# Patient Record
Sex: Male | Born: 1945 | Race: Black or African American | Hispanic: No | Smoking: Former smoker
Health system: Southern US, Community
[De-identification: ages and names within clinical notes are randomized; demographics above are authoritative.]

## PROBLEM LIST (undated history)

## (undated) DIAGNOSIS — G4730 Sleep apnea, unspecified: Secondary | ICD-10-CM

## (undated) DIAGNOSIS — I10 Essential (primary) hypertension: Secondary | ICD-10-CM

## (undated) DIAGNOSIS — N400 Enlarged prostate without lower urinary tract symptoms: Secondary | ICD-10-CM

## (undated) DIAGNOSIS — I2510 Atherosclerotic heart disease of native coronary artery without angina pectoris: Secondary | ICD-10-CM

## (undated) DIAGNOSIS — R7303 Prediabetes: Secondary | ICD-10-CM

## (undated) DIAGNOSIS — G4733 Obstructive sleep apnea (adult) (pediatric): Secondary | ICD-10-CM

## (undated) DIAGNOSIS — F32A Depression, unspecified: Secondary | ICD-10-CM

## (undated) DIAGNOSIS — R600 Localized edema: Secondary | ICD-10-CM

## (undated) DIAGNOSIS — G709 Myoneural disorder, unspecified: Secondary | ICD-10-CM

## (undated) DIAGNOSIS — E119 Type 2 diabetes mellitus without complications: Secondary | ICD-10-CM

## (undated) DIAGNOSIS — R972 Elevated prostate specific antigen [PSA]: Secondary | ICD-10-CM

## (undated) DIAGNOSIS — R0609 Other forms of dyspnea: Secondary | ICD-10-CM

## (undated) DIAGNOSIS — K219 Gastro-esophageal reflux disease without esophagitis: Secondary | ICD-10-CM

## (undated) DIAGNOSIS — R0600 Dyspnea, unspecified: Secondary | ICD-10-CM

## (undated) HISTORY — DX: Elevated prostate specific antigen (PSA): R97.20

## (undated) HISTORY — PX: COLON SURGERY: SHX602

---

## 2004-07-27 ENCOUNTER — Ambulatory Visit: Payer: Self-pay | Admitting: Gastroenterology

## 2004-08-22 ENCOUNTER — Inpatient Hospital Stay: Payer: Self-pay | Admitting: General Surgery

## 2004-09-03 ENCOUNTER — Emergency Department: Payer: Self-pay | Admitting: General Surgery

## 2004-09-06 ENCOUNTER — Inpatient Hospital Stay: Payer: Self-pay | Admitting: General Surgery

## 2005-01-21 HISTORY — PX: COLON RESECTION: SHX5231

## 2005-06-28 ENCOUNTER — Inpatient Hospital Stay: Payer: Self-pay | Admitting: Internal Medicine

## 2005-09-12 ENCOUNTER — Ambulatory Visit: Payer: Self-pay | Admitting: Gastroenterology

## 2009-10-05 ENCOUNTER — Ambulatory Visit: Payer: Self-pay | Admitting: Gastroenterology

## 2012-09-03 ENCOUNTER — Ambulatory Visit: Payer: Self-pay

## 2012-09-21 ENCOUNTER — Ambulatory Visit: Payer: Self-pay

## 2012-10-26 ENCOUNTER — Ambulatory Visit: Payer: Self-pay

## 2012-11-21 ENCOUNTER — Ambulatory Visit: Payer: Self-pay

## 2014-03-22 ENCOUNTER — Ambulatory Visit: Payer: Self-pay | Admitting: Infectious Diseases

## 2014-07-15 ENCOUNTER — Other Ambulatory Visit: Payer: Self-pay | Admitting: Orthopedic Surgery

## 2014-07-15 DIAGNOSIS — M25561 Pain in right knee: Secondary | ICD-10-CM

## 2014-07-21 ENCOUNTER — Ambulatory Visit: Payer: Self-pay

## 2014-07-22 ENCOUNTER — Ambulatory Visit
Admission: RE | Admit: 2014-07-22 | Discharge: 2014-07-22 | Disposition: A | Discharge status: Home / Self Care | Payer: Medicare Other | Source: Ambulatory Visit | Attending: Orthopedic Surgery | Admitting: Orthopedic Surgery

## 2014-07-22 ENCOUNTER — Ambulatory Visit: Payer: Self-pay

## 2014-07-22 DIAGNOSIS — M25561 Pain in right knee: Secondary | ICD-10-CM | POA: Insufficient documentation

## 2014-07-22 DIAGNOSIS — M25462 Effusion, left knee: Secondary | ICD-10-CM | POA: Diagnosis not present

## 2016-06-10 ENCOUNTER — Ambulatory Visit: Payer: Medicare HMO | Admitting: Anesthesiology

## 2016-06-10 ENCOUNTER — Encounter
Admission: RE | Disposition: A | Discharge status: Home / Self Care | Payer: Self-pay | Source: Ambulatory Visit | Attending: Gastroenterology

## 2016-06-10 ENCOUNTER — Ambulatory Visit
Admission: RE | Admit: 2016-06-10 | Discharge: 2016-06-10 | Disposition: A | Discharge status: Home / Self Care | Payer: Medicare HMO | Source: Ambulatory Visit | Attending: Gastroenterology | Admitting: Gastroenterology

## 2016-06-10 DIAGNOSIS — K621 Rectal polyp: Secondary | ICD-10-CM | POA: Diagnosis not present

## 2016-06-10 DIAGNOSIS — K219 Gastro-esophageal reflux disease without esophagitis: Secondary | ICD-10-CM | POA: Insufficient documentation

## 2016-06-10 DIAGNOSIS — Z9049 Acquired absence of other specified parts of digestive tract: Secondary | ICD-10-CM | POA: Diagnosis not present

## 2016-06-10 DIAGNOSIS — Z8719 Personal history of other diseases of the digestive system: Secondary | ICD-10-CM | POA: Diagnosis not present

## 2016-06-10 DIAGNOSIS — K640 First degree hemorrhoids: Secondary | ICD-10-CM | POA: Diagnosis not present

## 2016-06-10 DIAGNOSIS — I10 Essential (primary) hypertension: Secondary | ICD-10-CM | POA: Insufficient documentation

## 2016-06-10 DIAGNOSIS — Z980 Intestinal bypass and anastomosis status: Secondary | ICD-10-CM | POA: Diagnosis not present

## 2016-06-10 DIAGNOSIS — Z791 Long term (current) use of non-steroidal anti-inflammatories (NSAID): Secondary | ICD-10-CM | POA: Diagnosis not present

## 2016-06-10 DIAGNOSIS — Z87891 Personal history of nicotine dependence: Secondary | ICD-10-CM | POA: Insufficient documentation

## 2016-06-10 DIAGNOSIS — N400 Enlarged prostate without lower urinary tract symptoms: Secondary | ICD-10-CM | POA: Diagnosis not present

## 2016-06-10 DIAGNOSIS — Z1211 Encounter for screening for malignant neoplasm of colon: Secondary | ICD-10-CM | POA: Insufficient documentation

## 2016-06-10 DIAGNOSIS — Z79899 Other long term (current) drug therapy: Secondary | ICD-10-CM | POA: Insufficient documentation

## 2016-06-10 HISTORY — DX: Prediabetes: R73.03

## 2016-06-10 HISTORY — DX: Essential (primary) hypertension: I10

## 2016-06-10 HISTORY — DX: Benign prostatic hyperplasia without lower urinary tract symptoms: N40.0

## 2016-06-10 HISTORY — DX: Gastro-esophageal reflux disease without esophagitis: K21.9

## 2016-06-10 HISTORY — PX: COLONOSCOPY WITH PROPOFOL: SHX5780

## 2016-06-10 SURGERY — COLONOSCOPY WITH PROPOFOL
Anesthesia: General

## 2016-06-10 MED ORDER — PROPOFOL 500 MG/50ML IV EMUL
INTRAVENOUS | Status: AC
  Filled 2016-06-10: qty 50

## 2016-06-10 MED ORDER — SODIUM CHLORIDE 0.9 % IV SOLN
INTRAVENOUS | Status: DC
  Administered 2016-06-10: 10:00:00 via INTRAVENOUS

## 2016-06-10 MED ORDER — PHENYLEPHRINE HCL 10 MG/ML IJ SOLN
INTRAMUSCULAR | Status: DC | PRN
  Administered 2016-06-10 (×2): 100 ug via INTRAVENOUS

## 2016-06-10 MED ORDER — PROPOFOL 500 MG/50ML IV EMUL
INTRAVENOUS | Status: DC | PRN
  Administered 2016-06-10: 150 ug/kg/min via INTRAVENOUS

## 2016-06-10 MED ORDER — PROPOFOL 10 MG/ML IV BOLUS
INTRAVENOUS | Status: DC | PRN
  Administered 2016-06-10: 50 mg via INTRAVENOUS

## 2016-06-10 NOTE — Anesthesia Postprocedure Evaluation (Signed)
Anesthesia Post Note  Patient: Zachary Mathews  Procedure(s) Performed: Procedure(s) (LRB): COLONOSCOPY WITH PROPOFOL (N/A)  Patient location during evaluation: Endoscopy Anesthesia Type: General Level of consciousness: awake and alert Pain management: pain level controlled Vital Signs Assessment: post-procedure vital signs reviewed and stable Respiratory status: spontaneous breathing and respiratory function stable Cardiovascular status: stable Anesthetic complications: no     Last Vitals:  Vitals:   06/10/16 1057 06/10/16 1103  BP: 105/65 103/67  Pulse: 87 81  Resp: 20 19  Temp:      Last Pain:  Vitals:   06/10/16 1053  TempSrc: Tympanic                 Trinisha Paget K

## 2016-06-10 NOTE — Anesthesia Preprocedure Evaluation (Signed)
Anesthesia Evaluation  Patient identified by MRN, date of birth, ID band Patient awake    Reviewed: Allergy & Precautions, NPO status , Patient's Chart, lab work & pertinent test results  History of Anesthesia Complications Negative for: history of anesthetic complications  Airway Mallampati: II       Dental  (+) Partial Lower, Partial Upper   Pulmonary neg pulmonary ROS, former smoker,           Cardiovascular hypertension, Pt. on medications      Neuro/Psych negative neurological ROS     GI/Hepatic Neg liver ROS, GERD  Medicated and Controlled,  Endo/Other  negative endocrine ROS  Renal/GU negative Renal ROS     Musculoskeletal   Abdominal   Peds  Hematology negative hematology ROS (+)   Anesthesia Other Findings   Reproductive/Obstetrics                             Anesthesia Physical Anesthesia Plan  ASA: II  Anesthesia Plan: General   Post-op Pain Management:    Induction: Intravenous  Airway Management Planned: Nasal Cannula  Additional Equipment:   Intra-op Plan:   Post-operative Plan:   Informed Consent: I have reviewed the patients History and Physical, chart, labs and discussed the procedure including the risks, benefits and alternatives for the proposed anesthesia with the patient or authorized representative who has indicated his/her understanding and acceptance.     Plan Discussed with:   Anesthesia Plan Comments:         Anesthesia Quick Evaluation

## 2016-06-10 NOTE — Transfer of Care (Signed)
Immediate Anesthesia Transfer of Care Note  Patient: Zachary Mathews  Procedure(s) Performed: Procedure(s): COLONOSCOPY WITH PROPOFOL (N/A)  Patient Location: PACU  Anesthesia Type:General  Level of Consciousness: sedated  Airway & Oxygen Therapy: Patient Spontanous Breathing and Patient connected to nasal cannula oxygen  Post-op Assessment: Report given to RN and Post -op Vital signs reviewed and stable  Post vital signs: Reviewed and stable  Last Vitals:  Vitals:   06/10/16 0935  Pulse: 100  Resp: 18  Temp: (!) 35.6 C    Last Pain:  Vitals:   06/10/16 0935  TempSrc: Tympanic         Complications: No apparent anesthesia complications

## 2016-06-10 NOTE — Op Note (Signed)
[facility] Patient Name: Zachary Mathews Procedure Date: 06/10/2016 10:12 AM MRN: [id number] Account #: [id number] Date of Birth: 06-28-1945 Admit Type: Outpatient Age: 71 Room: [facility] ENDO ROOM 3 Gender: Male Note Status: Finalized Procedure:            Colonoscopy Indications:          High risk colon cancer surveillance: Personal history                        of colonic polyps, Personal history of colonic polyps Providers:            Lollie Sails, MD Referring MD:         Adrian Prows (Referring MD) Medicines:            Monitored Anesthesia Care Complications:        No immediate complications. Procedure:            Pre-Anesthesia Assessment:                       - ASA Grade Assessment: II - A patient with mild                        systemic disease.                       After obtaining informed consent, the colonoscope was                        passed under direct vision. Throughout the procedure,                        the patient's blood pressure, pulse, and oxygen                        saturations were monitored continuously. The                        Colonoscope was introduced through the anus and                        advanced to the the ileocolonic anastomosis. The                        colonoscopy was performed with moderate difficulty due                        to poor bowel prep. Successful completion of the                        procedure was aided by lavage. The patient tolerated                        the procedure well. The quality of the bowel                        preparation was fair. Findings:      A 1 mm polyp was found in the rectum. The polyp was sessile. The polyp       was removed with a cold biopsy forceps. Resection and retrieval were       complete.  There was evidence of a prior functional end-to-end ileo-colonic       anastomosis in the transverse colon. This was patent and was   characterized by healthy appearing mucosa and an intact staple line. The       staple location had a mild erythema surrounding it.      The exam was otherwise without abnormality.      Non-bleeding internal hemorrhoids were found during retroflexion and       during anoscopy. The hemorrhoids were small and Grade I (internal       hemorrhoids that do not prolapse). Impression:           - Preparation of the colon was fair.                       - One 1 mm polyp in the rectum, removed with a cold                        biopsy forceps. Resected and retrieved.                       - Patent functional end-to-end ileo-colonic                        anastomosis, characterized by healthy appearing mucosa                        and an intact staple line.                       - The examination was otherwise normal.                       - Non-bleeding internal hemorrhoids. Recommendation:       - Await pathology results.                       - Telephone GI clinic for pathology results in 1 week. Procedure Code(s):    --- Professional ---                       [phone], Colonoscopy, flexible; with biopsy, single or                        multiple Diagnosis Code(s):    --- Professional ---                       Z86.010, Personal history of colonic polyps                       K64.0, First degree hemorrhoids                       K62.1, Rectal polyp CPT copyright 2016 American Medical Association. All rights reserved. The codes documented in this report are preliminary and upon coder review may  be revised to meet current compliance requirements. Lollie Sails, MD 06/10/2016 10:54:03 AM This report has been signed electronically. Number of Addenda: 0 Note Initiated On: 06/10/2016 10:12 AM Scope Withdrawal Time: 0 hours 7 minutes 14 seconds  Total Procedure Duration: 0 hours 20 minutes 2 seconds       [facility]

## 2016-06-10 NOTE — H&P (Signed)
Outpatient short stay form Pre-procedure 06/10/2016 10:18 AM Lollie Sails MD  Primary Physician: Dr. Adrian Prows  Reason for visit:  Colonoscopy  History of present illness:  Patient is a 71 year old male presenting today as above. He has personal history of colon polyps. 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 He tolerated his prep well. He takes no blood thinning agents or aspirin products. It is of note that he does have some loose stools at times and is currently taking magnesium chloride tablets.    Current Facility-Administered Medications:  .  0.9 %  sodium chloride infusion, , Intravenous, Continuous, Lollie Sails, MD, Last Rate: 20 mL/hr at 06/10/16 0948 .  0.9 %  sodium chloride infusion, , Intravenous, Continuous, Lollie Sails, MD  Prescriptions Prior to Admission  Medication Sig Dispense Refill Last Dose  . dutasteride (AVODART) 0.5 MG capsule Take 0.5 mg by mouth daily.   06/10/2016 at Unknown time  . glucosamine-chondroitin 500-400 MG tablet Take 2 tablets by mouth daily.   06/10/2016 at Unknown time  . hydrochlorothiazide (HYDRODIURIL) 25 MG tablet Take 25 mg by mouth daily.   06/10/2016 at Unknown time  . lisinopril (PRINIVIL,ZESTRIL) 40 MG tablet Take 40 mg by mouth daily.   06/10/2016 at Unknown time  . magnesium chloride (SLOW-MAG) 64 MG TBEC SR tablet Take 2 tablets by mouth daily.   06/10/2016 at Unknown time  . naproxen sodium (ANAPROX) 220 MG tablet Take 220 mg by mouth daily.   06/10/2016 at Unknown time  . omeprazole (PRILOSEC) 40 MG capsule Take 40 mg by mouth daily.   06/10/2016 at Unknown time  . potassium chloride SA (K-DUR,KLOR-CON) 20 MEQ tablet Take 20 mEq by mouth 2 (two) times daily.   06/10/2016 at Unknown time  . tamsulosin (FLOMAX) 0.4 MG CAPS capsule Take 0.4 mg by mouth daily before breakfast.   06/10/2016 at Unknown time     Not on File   Past  Medical History:  Diagnosis Date  . BPH (benign prostatic hyperplasia)   . GERD (gastroesophageal reflux disease)   . Hypertension   . Pre-diabetes     Review of systems:      Physical Exam    Heart and lungs: Regular rate and rhythm without rub or gallop, lungs are bilaterally clear.    HEENT: Normal cephalic atraumatic eyes are anicteric    Other:     Pertinant exam for procedure: Soft nontender nondistended bowel sounds positive normoactive.    Planned proceedures: Colonoscopy and indicated procedures. I have discussed the risks benefits and complications of procedures to include not limited to bleeding, infection, perforation and the risk of sedation and the patient wishes to proceed.    Lollie Sails, MD Gastroenterology 06/10/2016  10:18 AM

## 2016-06-10 NOTE — Anesthesia Procedure Notes (Signed)
Date/Time: 06/10/2016 10:28 AM Performed by: Nelda Marseille Pre-anesthesia Checklist: Patient identified, Emergency Drugs available, Suction available, Patient being monitored and Timeout performed Oxygen Delivery Method: Nasal cannula

## 2016-06-10 NOTE — Anesthesia Post-op Follow-up Note (Cosign Needed)
Anesthesia QCDR form completed.        

## 2016-06-12 LAB — SURGICAL PATHOLOGY

## 2016-06-13 ENCOUNTER — Encounter: Payer: Self-pay | Admitting: Gastroenterology

## 2017-09-10 ENCOUNTER — Ambulatory Visit: Payer: Medicare HMO | Admitting: Urology

## 2017-09-10 ENCOUNTER — Encounter: Payer: Self-pay | Admitting: Urology

## 2017-09-10 VITALS — BP 126/76 | HR 86 | Ht 68.0 in | Wt 217.9 lb

## 2017-09-10 DIAGNOSIS — N5201 Erectile dysfunction due to arterial insufficiency: Secondary | ICD-10-CM

## 2017-09-10 DIAGNOSIS — N401 Enlarged prostate with lower urinary tract symptoms: Secondary | ICD-10-CM

## 2017-09-11 ENCOUNTER — Encounter: Payer: Self-pay | Admitting: Urology

## 2017-09-11 DIAGNOSIS — N401 Enlarged prostate with lower urinary tract symptoms: Secondary | ICD-10-CM | POA: Insufficient documentation

## 2017-09-11 DIAGNOSIS — N5201 Erectile dysfunction due to arterial insufficiency: Secondary | ICD-10-CM | POA: Insufficient documentation

## 2017-09-11 MED ORDER — TADALAFIL 20 MG PO TABS: ORAL_TABLET | ORAL | 3 refills | Status: DC

## 2017-09-11 NOTE — Progress Notes (Signed)
09/10/2017 7:28 AM   Zachary Mathews [birth year] [id number]  Referring provider: Leonel Ramsay, MD [place], [place] [address]  Chief Complaint  Patient presents with  . Erectile Dysfunction    HPI: 72 year old male presents for evaluation of erectile dysfunction.  I have previously seen him at [facility] for BPH with lower urinary tract symptoms and erectile dysfunction.  He has been on tamsulosin and dutasteride for many years and has stable lower urinary tract symptoms.  He has also has a long history of erectile dysfunction.  He has taken vardenafil in the past without any improvement.  He was previously having partial erections firm enough for penetration less than 50% of the time.  He denied pain or curvature with erections.  Over the last 12 months he states he has no significant erections.  He did have a testosterone level checked which was low normal.   PMH: Past Medical History:  Diagnosis Date  . BPH (benign prostatic hyperplasia)   . GERD (gastroesophageal reflux disease)   . Hypertension   . Pre-diabetes     Surgical History: Past Surgical History:  Procedure Laterality Date  . COLON RESECTION  2007  . COLONOSCOPY WITH PROPOFOL N/A 06/10/2016   Procedure: COLONOSCOPY WITH PROPOFOL;  Surgeon: Lollie Sails, MD;  Location: [facility] ENDOSCOPY;  Service: Endoscopy;  Laterality: N/A;    Home Medications:  Allergies as of 09/10/2017   No Known Allergies     Medication List        Accurate as of 09/10/17 11:59 PM. Always use your most recent med list.          CONTOUR NEXT MONITOR w/Device Kit 1 each by XX route as directed   dutasteride 0.5 MG capsule Commonly known as:  AVODART Take 0.5 mg by mouth daily.   glucosamine-chondroitin 500-400 MG tablet Take 2 tablets by mouth daily.   hydrochlorothiazide 25 MG tablet Commonly known as:  HYDRODIURIL Take 25 mg by mouth daily.   ibuprofen 600 MG tablet Commonly known as:  ADVIL,MOTRIN     lisinopril 40 MG tablet Commonly known as:  PRINIVIL,ZESTRIL Take 40 mg by mouth daily.   Magnesium Chloride 64 MG Tbec Take by mouth.   magnesium chloride 64 MG Tbec SR tablet Commonly known as:  SLOW-MAG Take 2 tablets by mouth daily.   MOVE FREE JOINT HEALTH ADVANCE PO Take by mouth.   naproxen sodium 220 MG tablet Commonly known as:  ALEVE Take 220 mg by mouth daily.   omeprazole 40 MG capsule Commonly known as:  PRILOSEC Take 40 mg by mouth daily.   potassium chloride SA 20 MEQ tablet Commonly known as:  K-DUR,KLOR-CON Take 20 mEq by mouth 2 (two) times daily.   PRECISION QID TEST test strip Generic drug:  glucose blood Use once daily Use as instructed.   tadalafil 5 MG tablet Commonly known as:  CIALIS Take by mouth.   tamsulosin 0.4 MG Caps capsule Commonly known as:  FLOMAX Take 0.4 mg by mouth daily before breakfast.       Allergies: No Known Allergies  Family History: No family history on file.  Social History:  reports that he has quit smoking. He has never used smokeless tobacco. He reports that he does not drink alcohol or use drugs.  ROS: UROLOGY Frequent Urination?: Yes Hard to postpone urination?: Yes Burning/pain with urination?: No Get up at night to urinate?: Yes Leakage of urine?: Yes Urine stream starts and stops?: No Trouble  starting stream?: Yes Do you have to strain to urinate?: No Blood in urine?: No Urinary tract infection?: No Sexually transmitted disease?: No Injury to kidneys or bladder?: No Painful intercourse?: No Weak stream?: No Erection problems?: Yes Penile pain?: No  Gastrointestinal Nausea?: No Vomiting?: No Indigestion/heartburn?: Yes Diarrhea?: No Constipation?: No  Constitutional Fever: No Night sweats?: Yes Weight loss?: Yes Fatigue?: Yes  Skin Skin rash/lesions?: No Itching?: Yes  Eyes Blurred vision?: No Double vision?: No  Ears/Nose/Throat Sore throat?: No Sinus problems?:  No  Hematologic/Lymphatic Swollen glands?: No Easy bruising?: No  Cardiovascular Leg swelling?: No Chest pain?: No  Respiratory Cough?: No Shortness of breath?: No  Endocrine Excessive thirst?: No  Musculoskeletal Back pain?: Yes Joint pain?: Yes  Neurological Headaches?: No Dizziness?: Yes  Psychologic Depression?: No Anxiety?: No  Physical Exam: BP 126/76 (BP Location: Left Arm, Patient Position: Sitting, Cuff Size: Large)   Pulse 86   Ht '5\' 8"'$  (1.727 m)   Wt 217 lb 14.4 oz (98.8 kg)   BMI 33.13 kg/m   Constitutional:  Alert and oriented, No acute distress. HEENT: [place] AT, moist mucus membranes.  Trachea midline, no masses. Cardiovascular: No clubbing, cyanosis, or edema. Respiratory: Normal respiratory effort, no increased work of breathing. GI: Abdomen is soft, nontender, nondistended, no abdominal masses GU: No CVA tenderness Lymph: No cervical or inguinal lymphadenopathy. Skin: No rashes, bruises or suspicious lesions. Neurologic: Grossly intact, no focal deficits, moving all 4 extremities. Psychiatric: Normal mood and affect.   Assessment & Plan:   72 year old male with erectile dysfunction.  He presently has no significant erectile activity and was informed it is unlikely other PDE 5 inhibitors would be beneficial.  I discussed vacuum erection devices and intracavernosal injections and briefly discussed penile implant surgery.  He initially wanted to try a different PDE 5 inhibitor and Rx generic tadalafil was sent to [facility].  If not effective he will call back should he desire to pursue other options which were discussed.  He has stable lower urinary tract symptoms and will continue tamsulosin/dutasteride.    Abbie Sons, [place] [address] [place], [place] [address] [phone]

## 2017-12-29 ENCOUNTER — Other Ambulatory Visit: Payer: Self-pay

## 2017-12-29 ENCOUNTER — Encounter: Payer: Self-pay | Admitting: Emergency Medicine

## 2017-12-29 ENCOUNTER — Ambulatory Visit
Admission: EM | Admit: 2017-12-29 | Discharge: 2017-12-29 | Disposition: A | Discharge status: Home / Self Care | Payer: Medicare HMO | Attending: Family Medicine | Admitting: Family Medicine

## 2017-12-29 ENCOUNTER — Ambulatory Visit (INDEPENDENT_AMBULATORY_CARE_PROVIDER_SITE_OTHER): Payer: Medicare HMO

## 2017-12-29 DIAGNOSIS — M79632 Pain in left forearm: Secondary | ICD-10-CM | POA: Diagnosis not present

## 2017-12-29 DIAGNOSIS — M25522 Pain in left elbow: Secondary | ICD-10-CM | POA: Diagnosis not present

## 2017-12-29 MED ORDER — MELOXICAM 15 MG PO TABS: 15.0000 mg | ORAL_TABLET | Freq: Every day | ORAL | 0 refills | Status: DC | PRN

## 2017-12-29 NOTE — ED Triage Notes (Signed)
Pt c/o left forearm pain with rotation. He reports that it started about a month ago but has been getting worse. He feels like something pulled in it last week. He had a hard time shaving this morning because of the pain.

## 2017-12-29 NOTE — Discharge Instructions (Signed)
Rest.  No lifting.  Medication as needed.  If it persists, see ortho ([facility] or [facility]).  Take care  Dr. Lacinda Axon

## 2017-12-29 NOTE — ED Provider Notes (Signed)
[facility]    CSN: [id number] Arrival date & time: 12/29/17  1041  History   Chief Complaint Chief Complaint  Patient presents with  . Arm Pain    left forearm   HPI  72 year old male presents with left forearm pain.  Patient reports this is been going on for the past few months.  Worse as of last week.  Endorses pain of the dorsal forearm.  He states that the pain occurs when he pronates and supinates his wrist/forearm.  Also hurts with lifting objects.  Improves with rest.  However, it is still present at rest.  He has tried ibuprofen and heat without resolution.  No recent fall, trauma, injury.  Moderate in severity.  No other associated symptoms.  No other complaints.  PMH, Surgical Hx, Family Hx, Social History reviewed and updated as below.  Past Medical History:  Diagnosis Date  . BPH (benign prostatic hyperplasia)   . GERD (gastroesophageal reflux disease)   . Hypertension   . Pre-diabetes     Patient Active Problem List   Diagnosis Date Noted  . Erectile dysfunction due to arterial insufficiency 09/11/2017  . Benign prostatic hyperplasia with lower urinary tract symptoms 09/11/2017    Past Surgical History:  Procedure Laterality Date  . COLON RESECTION  2007  . COLONOSCOPY WITH PROPOFOL N/A 06/10/2016   Procedure: COLONOSCOPY WITH PROPOFOL;  Surgeon: Lollie Sails, MD;  [facility] ENDOSCOPY;  Service: Endoscopy;  Laterality: N/A;       Home Medications    Prior to Admission medications   Medication Sig Start Date End Date Taking? Authorizing Provider  dutasteride (AVODART) 0.5 MG capsule Take 0.5 mg by mouth daily.   Yes [provider]  Glucos-Chond-Hyal Ac-Ca Fructo (MOVE FREE JOINT HEALTH ADVANCE PO) Take by mouth.   Yes [provider]  glucosamine-chondroitin 500-400 MG tablet Take 2 tablets by mouth daily.   Yes [provider]  glucose blood (PRECISION QID TEST) test strip Use once daily Use as  instructed. 04/30/17 04/30/18 Yes [provider]  hydrochlorothiazide (HYDRODIURIL) 25 MG tablet Take 25 mg by mouth daily.   Yes [provider]  lisinopril (PRINIVIL,ZESTRIL) 40 MG tablet Take 40 mg by mouth daily.   Yes [provider]  magnesium chloride (SLOW-MAG) 64 MG TBEC SR tablet Take 2 tablets by mouth daily.   Yes [provider]  Magnesium Chloride 64 MG TBEC Take by mouth.   Yes [provider]  omeprazole (PRILOSEC) 40 MG capsule Take 40 mg by mouth daily.   Yes [provider]  potassium chloride SA (K-DUR,KLOR-CON) 20 MEQ tablet Take 20 mEq by mouth 2 (two) times daily.   Yes [provider]  tadalafil (ADCIRCA/CIALIS) 20 MG tablet 1 tab 1 hour prior to intercourse not to exceed 1 tablet daily 09/11/17  Yes Stoioff, Ronda Fairly, MD  tamsulosin (FLOMAX) 0.4 MG CAPS capsule Take 0.4 mg by mouth daily before breakfast.   Yes [provider]  Blood Glucose Monitoring Suppl (CONTOUR NEXT MONITOR) w/Device KIT 1 each by XX route as directed 04/24/17   [provider]  meloxicam (MOBIC) 15 MG tablet Take 1 tablet (15 mg total) by mouth daily as needed. 12/29/17   Coral Spikes, DO    Family History History reviewed. No pertinent family history.  Social History Social History   Tobacco Use  . Smoking status: Former Research scientist (life sciences)  . Smokeless tobacco: Never Used  Substance Use Topics  . Alcohol use:  No  . Drug use: Never     Allergies   Patient has no known allergies.   Review of Systems Review of Systems  Constitutional: Negative.   Musculoskeletal:       Forearm pain.   Physical Exam Triage Vital Signs ED Triage Vitals  Enc Vitals Group     BP 12/29/17 1119 136/89     Pulse Rate 12/29/17 1119 79     Resp 12/29/17 1119 18     Temp 12/29/17 1119 97.6 F (36.4 C)     Temp Source 12/29/17 1119 Oral     SpO2 12/29/17 1119 95 %     Weight 12/29/17 1115 218 lb (98.9 kg)     Height 12/29/17 1115 _0   (1.753 m)     Head Circumference --      Peak Flow --      Pain Score 12/29/17 1115 8     Pain Loc --      Pain Edu? --      Excl. in [place]? --    Updated Vital Signs BP 136/89 (BP Location: Left Arm)   Pulse 79   Temp 97.6 F (36.4 C) (Oral)   Resp 18   Ht _1  (1.753 m)   Wt 98.9 kg   SpO2 95%   BMI 32.19 kg/m   Visual Acuity Right Eye Distance:   Left Eye Distance:   Bilateral Distance:    Right Eye Near:   Left Eye Near:    Bilateral Near:     Physical Exam  Constitutional: He is oriented to person, place, and time. He appears well-developed. No distress.  HENT:  Head: Normocephalic and atraumatic.  Pulmonary/Chest: Effort normal. No respiratory distress.  Musculoskeletal:  Left forearm/elbow -patient with tenderness in the middle of the forearm along the musculature.  No epicondyle tenderness.  Pain is worse with supination and pronation.  Neurological: He is alert and oriented to person, place, and time.  Psychiatric: He has a normal mood and affect. His behavior is normal.  Nursing note and vitals reviewed.  UC Treatments / Results  Labs (all labs ordered are listed, but only abnormal results are displayed) Labs Reviewed - No data to display  EKG None  Radiology Dg Elbow Complete Left  Result Date: 12/29/2017 CLINICAL DATA:  Patient suffered a left elbow injury 5 days ago when he picked up a 5-10 pound object. Initial encounter. EXAM: LEFT ELBOW - COMPLETE 3+ VIEW COMPARISON:  None. FINDINGS: There is no evidence of fracture, dislocation, or joint effusion. There is no evidence of arthropathy or other focal bone abnormality. Soft tissues are unremarkable. IMPRESSION: Negative exam. Electronically Signed   By: Inge Rise M.D.   On: 12/29/2017 12:14    Procedures Procedures (including critical care time)  Medications Ordered in UC Medications - No data to display  Initial Impression / Assessment and Plan / UC Course  I have reviewed the triage  vital signs and the nursing notes.  Pertinent labs & imaging results that were available during my care of the patient were reviewed by me and considered in my medical decision making (see chart for details).    72 year old male presents with forearm pain.  X-rays negative.  No evidence of lateral or medial epicondylitis.  Does not fit clinically with de Quervain's or other wrist pathology.  Placed on Mobic.  Advised to see orthopedics if persists.  Final Clinical Impressions(s) / UC Diagnoses   Final diagnoses:  Pain of left  forearm     Discharge Instructions     Rest.  No lifting.  Medication as needed.  If it persists, see ortho ([facility] or [facility]).  Take care  Dr. Lacinda Axon    ED Prescriptions    Medication Sig Dispense Auth. Provider   meloxicam (MOBIC) 15 MG tablet Take 1 tablet (15 mg total) by mouth daily as needed. 14 tablet Coral Spikes, DO     Controlled Substance Prescriptions [place] Controlled Substance Registry consulted? Not Applicable   Coral Spikes, DO 12/29/17 1227

## 2021-05-02 ENCOUNTER — Other Ambulatory Visit (HOSPITAL_COMMUNITY): Payer: Self-pay | Admitting: Emergency Medicine

## 2021-05-02 ENCOUNTER — Other Ambulatory Visit: Payer: Self-pay | Admitting: Student

## 2021-05-02 ENCOUNTER — Telehealth (HOSPITAL_COMMUNITY): Payer: Self-pay | Admitting: Emergency Medicine

## 2021-05-02 DIAGNOSIS — R079 Chest pain, unspecified: Secondary | ICD-10-CM

## 2021-05-02 DIAGNOSIS — R0602 Shortness of breath: Secondary | ICD-10-CM

## 2021-05-02 DIAGNOSIS — I208 Other forms of angina pectoris: Secondary | ICD-10-CM

## 2021-05-02 DIAGNOSIS — I2089 Other forms of angina pectoris: Secondary | ICD-10-CM

## 2021-05-02 DIAGNOSIS — R5383 Other fatigue: Secondary | ICD-10-CM

## 2021-05-02 DIAGNOSIS — R0789 Other chest pain: Secondary | ICD-10-CM

## 2021-05-02 MED ORDER — IVABRADINE HCL 5 MG PO TABS: 15.0000 mg | ORAL_TABLET | Freq: Once | ORAL | 0 refills | Status: AC

## 2021-05-02 MED ORDER — METOPROLOL TARTRATE 100 MG PO TABS: 100.0000 mg | ORAL_TABLET | Freq: Once | ORAL | 0 refills | Status: DC

## 2021-05-02 NOTE — Telephone Encounter (Signed)
Reaching out to patient to offer assistance regarding upcoming cardiac imaging study; pt verbalizes understanding of appt date/time, parking situation and where to check in, pre-test NPO status and medications ordered, and verified current allergies; name and call back number provided for further questions should they arise ?Marchia Bond RN Navigator Cardiac Imaging ?[facility] ?[phone] office ?[phone] cell ? ?'100mg'$  metoprolol tartrate + '15mg'$  ivabradine ?Arrival 845 ? ?

## 2021-05-02 NOTE — Progress Notes (Signed)
One time dose '100mg'$  metoprolol tartrate + '15mg'$  ivabradine ordered for CCTA HR control per BA chart review.  ? ?Will send rx after confirming pharm with patient. ? ?Marchia Bond RN Navigator Cardiac Imaging ?[facility] ?[phone] Office  ?[phone] Cell ? ?[facility] [address] [place] ?

## 2021-05-03 ENCOUNTER — Ambulatory Visit
Admission: RE | Admit: 2021-05-03 | Discharge: 2021-05-03 | Disposition: A | Discharge status: Home / Self Care | Payer: Medicare HMO | Source: Ambulatory Visit | Attending: Student | Admitting: Student

## 2021-05-03 ENCOUNTER — Other Ambulatory Visit: Payer: Self-pay | Admitting: Student

## 2021-05-03 DIAGNOSIS — R931 Abnormal findings on diagnostic imaging of heart and coronary circulation: Secondary | ICD-10-CM | POA: Insufficient documentation

## 2021-05-03 DIAGNOSIS — R0602 Shortness of breath: Secondary | ICD-10-CM | POA: Diagnosis present

## 2021-05-03 DIAGNOSIS — R5383 Other fatigue: Secondary | ICD-10-CM | POA: Insufficient documentation

## 2021-05-03 DIAGNOSIS — R079 Chest pain, unspecified: Secondary | ICD-10-CM | POA: Diagnosis present

## 2021-05-03 DIAGNOSIS — I2510 Atherosclerotic heart disease of native coronary artery without angina pectoris: Secondary | ICD-10-CM | POA: Diagnosis not present

## 2021-05-03 DIAGNOSIS — I208 Other forms of angina pectoris: Secondary | ICD-10-CM | POA: Insufficient documentation

## 2021-05-03 LAB — POCT I-STAT CREATININE: Creatinine, Ser: 1.1 mg/dL (ref 0.61–1.24)

## 2021-05-03 MED ORDER — NITROGLYCERIN 0.4 MG SL SUBL
0.8000 mg | SUBLINGUAL_TABLET | Freq: Once | SUBLINGUAL | Status: AC
  Administered 2021-05-03: 0.8 mg via SUBLINGUAL

## 2021-05-03 MED ORDER — IOHEXOL 350 MG/ML SOLN
100.0000 mL | Freq: Once | INTRAVENOUS | Status: AC | PRN
  Administered 2021-05-03: 100 mL via INTRAVENOUS

## 2021-05-03 NOTE — Progress Notes (Signed)
Patient tolerated procedure well. Ambulate w/o difficulty. Denies light headedness or being dizzy. Sitting in chair drinking water provided. Encouraged to drink extra water today and reasoning explained. Verbalized understanding. All questions answered. ABC intact. No further needs. Discharge from procedure area w/o issues.   °

## 2021-05-04 DIAGNOSIS — R931 Abnormal findings on diagnostic imaging of heart and coronary circulation: Secondary | ICD-10-CM | POA: Diagnosis not present

## 2021-12-19 ENCOUNTER — Ambulatory Visit (INDEPENDENT_AMBULATORY_CARE_PROVIDER_SITE_OTHER): Payer: Medicare HMO | Admitting: Urology

## 2021-12-19 VITALS — BP 141/92 | HR 123 | Ht 69.0 in | Wt 227.0 lb

## 2021-12-19 DIAGNOSIS — N5201 Erectile dysfunction due to arterial insufficiency: Secondary | ICD-10-CM

## 2021-12-19 MED ORDER — SILDENAFIL CITRATE 100 MG PO TABS: ORAL_TABLET | ORAL | 0 refills | Status: DC

## 2021-12-19 NOTE — Progress Notes (Signed)
12/19/2021 10:50 AM   Zachary Mathews 06/27/1945 [id number]  Referring provider: Baxter Hire, MD [place],  [place] [address]  Chief Complaint  Patient presents with   Erectile Dysfunction    HPI: Zachary Mathews is a 76 y.o. male referred for evaluation of erectile dysfunction.  1 year history of ED She has partial erections which are not firm enough for penetration No pain or curvature with erections Organic risk factors include hypertension and antihypertensive medications.  Prior history of tobacco use He was given an Rx for tadalafil 20 mg however states he could not afford and did not get the Rx No use of oral or sublingual nitrates On tamsulosin/finasteride for BPH Denies tiredness, fatigue or decreased libido  PMH: Past Medical History:  Diagnosis Date   BPH (benign prostatic hyperplasia)    GERD (gastroesophageal reflux disease)    Hypertension    Pre-diabetes     Surgical History: Past Surgical History:  Procedure Laterality Date   COLON RESECTION  2007   COLONOSCOPY WITH PROPOFOL N/A 06/10/2016   Procedure: COLONOSCOPY WITH PROPOFOL;  Surgeon: Zachary Sails, MD;  Location: [facility] ENDOSCOPY;  Service: Endoscopy;  Laterality: N/A;    Home Medications:  Allergies as of 12/19/2021   No Known Allergies      Medication List        Accurate as of December 19, 2021 10:50 AM. If you have any questions, ask your nurse or doctor.          Contour Next Monitor w/Device Kit 1 each by XX route as directed   dutasteride 0.5 MG capsule Commonly known as: AVODART Take 0.5 mg by mouth daily.   glucosamine-chondroitin 500-400 MG tablet Take 2 tablets by mouth daily.   hydrochlorothiazide 25 MG tablet Commonly known as: HYDRODIURIL Take 25 mg by mouth daily.   lisinopril 40 MG tablet Commonly known as: ZESTRIL Take 40 mg by mouth daily.   Magnesium Chloride 64 MG Tbec Take by mouth.   magnesium chloride 64 MG Tbec SR  tablet Commonly known as: SLOW-MAG Take 2 tablets by mouth daily.   meloxicam 15 MG tablet Commonly known as: MOBIC Take 1 tablet (15 mg total) by mouth daily as needed.   metoprolol tartrate 100 MG tablet Commonly known as: LOPRESSOR Take 1 tablet (100 mg total) by mouth once for 1 dose. Please take one time dose 150m metoprolol tartrate 2 hr prior to cardiac CT for HR control IF HR >55bpm.   MOVE FREE JOINT HEALTH ADVANCE PO Take by mouth.   omeprazole 40 MG capsule Commonly known as: PRILOSEC Take 40 mg by mouth daily.   potassium chloride SA 20 MEQ tablet Commonly known as: KLOR-CON M Take 20 mEq by mouth 2 (two) times daily.   tadalafil 20 MG tablet Commonly known as: CIALIS 1 tab 1 hour prior to intercourse not to exceed 1 tablet daily   tamsulosin 0.4 MG Caps capsule Commonly known as: FLOMAX Take 0.4 mg by mouth daily before breakfast.        Allergies: No Known Allergies  Family History: No family history on file.  Social History:  reports that he has quit smoking. He has never used smokeless tobacco. He reports that he does not drink alcohol and does not use drugs.   Physical Exam: BP (!) 141/92   Pulse (!) 123   Ht _0  (1.753 m)   Wt 227 lb (103 kg)   BMI 33.52 kg/m  Constitutional:  Alert and oriented, No acute distress. HEENT: [place] AT Respiratory: Normal respiratory effort, no increased work of breathing. GI: Abdomen is soft, nontender, nondistended, no abdominal masses GU: Phallus without lesions or plaques.  Testes descended bilaterally without masses or tenderness Skin: No rashes, bruises or suspicious lesions. Neurologic: Grossly intact, no focal deficits, moving all 4 extremities. Psychiatric: Normal mood and affect.   Assessment & Plan:    1.  Erectile dysfunction Has not tried PDE 5 inhibitors We discussed these medications are an expensive with use of GoodRx Rx sildenafil 100 mg in pharmacy.  He was instructed to take 1 hour  prior to intercourse and foreplay/stimulation will help to achieve efficacy The most common side effects of headache and flushing were reviewed If he does not have significant side effects recommend he try on at least 5 occasions to determine efficacy If not effective second line options were discussed including backing erection devices and intracavernosal injections.  He was provided literature   Zachary Sons, MD  [facility] [address], [place], [place] [address] [phone]

## 2021-12-20 ENCOUNTER — Encounter: Payer: Self-pay | Admitting: Urology

## 2021-12-21 ENCOUNTER — Encounter: Payer: Self-pay | Admitting: Emergency Medicine

## 2021-12-21 ENCOUNTER — Ambulatory Visit
Admission: EM | Admit: 2021-12-21 | Discharge: 2021-12-21 | Disposition: A | Discharge status: Home / Self Care | Payer: Medicare HMO | Attending: Urgent Care | Admitting: Urgent Care

## 2021-12-21 DIAGNOSIS — R21 Rash and other nonspecific skin eruption: Secondary | ICD-10-CM | POA: Diagnosis not present

## 2021-12-21 MED ORDER — TRIAMCINOLONE ACETONIDE 0.1 % EX CREA: 1.0000 | TOPICAL_CREAM | Freq: Two times a day (BID) | CUTANEOUS | 0 refills | Status: AC

## 2021-12-21 NOTE — ED Triage Notes (Signed)
Patient c/o itchy rough rash on his face and neck that started 2 weeks ago.  Patient has been putting hydrocortisone cream on the rash.

## 2021-12-21 NOTE — ED Provider Notes (Signed)
[facility]    CSN: [id number] Arrival date & time: 12/21/21  1328      History   Chief Complaint Chief Complaint  Patient presents with   Rash    HPI OBALOLUWA DELATTE is a 76 y.o. male.    Rash   Patient with complaint of itchy rash on his face and neck starting 2 weeks ago.  Has been treated with hydrocortisone cream without improvement.  Lesions are across his forehead as well as his neck leading up to his jawline.  He also has lesions on his wrists.  Lesions are rough and sandpaper feeling.  Itchy.  Past Medical History:  Diagnosis Date   BPH (benign prostatic hyperplasia)    GERD (gastroesophageal reflux disease)    Hypertension    Pre-diabetes     Patient Active Problem List   Diagnosis Date Noted   Erectile dysfunction due to arterial insufficiency 09/11/2017   Benign prostatic hyperplasia with lower urinary tract symptoms 09/11/2017    Past Surgical History:  Procedure Laterality Date   COLON RESECTION  2007   COLONOSCOPY WITH PROPOFOL N/A 06/10/2016   Procedure: COLONOSCOPY WITH PROPOFOL;  Surgeon: Lollie Sails, MD;  Location: [facility] ENDOSCOPY;  Service: Endoscopy;  Laterality: N/A;       Home Medications    Prior to Admission medications   Medication Sig Start Date End Date Taking? Authorizing Provider  Blood Glucose Monitoring Suppl (CONTOUR NEXT MONITOR) w/Device KIT 1 each by XX route as directed 04/24/17   [provider]  dutasteride (AVODART) 0.5 MG capsule Take 0.5 mg by mouth daily.    [provider]  Glucos-Chond-Hyal Ac-Ca Fructo (MOVE FREE JOINT HEALTH ADVANCE PO) Take by mouth.    [provider]  glucosamine-chondroitin 500-400 MG tablet Take 2 tablets by mouth daily.    [provider]  hydrochlorothiazide (HYDRODIURIL) 25 MG tablet Take 25 mg by mouth daily.    [provider]  lisinopril (PRINIVIL,ZESTRIL) 40 MG tablet Take 40 mg by mouth daily.    [provider]   magnesium chloride (SLOW-MAG) 64 MG TBEC SR tablet Take 2 tablets by mouth daily.    [provider]  Magnesium Chloride 64 MG TBEC Take by mouth.    [provider]  meloxicam (MOBIC) 15 MG tablet Take 1 tablet (15 mg total) by mouth daily as needed. 12/29/17   Coral Spikes, DO  metoprolol tartrate (LOPRESSOR) 100 MG tablet Take 1 tablet (100 mg total) by mouth once for 1 dose. Please take one time dose 168m metoprolol tartrate 2 hr prior to cardiac CT for HR control IF HR >55bpm. 05/02/21 05/02/21  AKate Sable MD  omeprazole (PRILOSEC) 40 MG capsule Take 40 mg by mouth daily.    [provider]  potassium chloride SA (K-DUR,KLOR-CON) 20 MEQ tablet Take 20 mEq by mouth 2 (two) times daily.    [provider]  sildenafil (VIAGRA) 100 MG tablet Take 1 tab 1 hour prior to intercourse 12/19/21   Stoioff, SRonda Fairly MD  tamsulosin (FLOMAX) 0.4 MG CAPS capsule Take 0.4 mg by mouth daily before breakfast.    [provider]    Family History History reviewed. No pertinent family history.  Social History Social History   Tobacco Use   Smoking status: Former   Smokeless tobacco: Never  VScientific laboratory technicianUse: Never used  Substance Use Topics   Alcohol use: No   Drug use: Never  Allergies   Patient has no known allergies.   Review of Systems Review of Systems  Skin:  Positive for rash.     Physical Exam Triage Vital Signs ED Triage Vitals  Enc Vitals Group     BP 12/21/21 1433 (!) 137/91     Pulse Rate 12/21/21 1433 87     Resp 12/21/21 1433 15     Temp 12/21/21 1433 98.5 F (36.9 C)     Temp Source 12/21/21 1433 Oral     SpO2 12/21/21 1433 100 %     Weight 12/21/21 1431 227 lb 1.2 oz (103 kg)     Height 12/21/21 1431 _0  (1.753 m)     Head Circumference --      Peak Flow --      Pain Score 12/21/21 1431 0     Pain Loc --      Pain Edu? --      Excl. in [place]? --    No data found.  Updated Vital Signs BP (!)  137/91 (BP Location: Left Arm)   Pulse 87   Temp 98.5 F (36.9 C) (Oral)   Resp 15   Ht _1  (1.753 m)   Wt 227 lb 1.2 oz (103 kg)   SpO2 100%   BMI 33.53 kg/m   Visual Acuity Right Eye Distance:   Left Eye Distance:   Bilateral Distance:    Right Eye Near:   Left Eye Near:    Bilateral Near:     Physical Exam Vitals reviewed.  Constitutional:      Appearance: Normal appearance.  Skin:    Findings: Rash present.       Neurological:     General: No focal deficit present.     Mental Status: He is alert and oriented to person, place, and time.  Psychiatric:        Mood and Affect: Mood normal.        Behavior: Behavior normal.      UC Treatments / Results  Labs (all labs ordered are listed, but only abnormal results are displayed) Labs Reviewed - No data to display  EKG   Radiology No results found.  Procedures Procedures (including critical care time)  Medications Ordered in UC Medications - No data to display  Initial Impression / Assessment and Plan / UC Course  I have reviewed the triage vital signs and the nursing notes.  Pertinent labs & imaging results that were available during my care of the patient were reviewed by me and considered in my medical decision making (see chart for details).  Atopic dermatitis of unknown etiology.  Patient with history of DM 2 so will avoid oral corticosteroids.  Treating with triamcinolone cream.  Asked patient to follow-up with his primary care provider.  Final Clinical Impressions(s) / UC Diagnoses   Final diagnoses:  None   Discharge Instructions   None    ED Prescriptions   None    PDMP not reviewed this encounter.   Rose Phi, [place] 12/21/21 1444

## 2021-12-21 NOTE — Discharge Instructions (Signed)
Follow up with your primary care provider if your symptoms are worsening or not improving.    

## 2022-02-06 ENCOUNTER — Ambulatory Visit
Admission: RE | Admit: 2022-02-06 | Discharge: 2022-02-06 | Disposition: A | Discharge status: Home / Self Care | Payer: Medicare HMO | Attending: Internal Medicine | Admitting: Internal Medicine

## 2022-02-06 ENCOUNTER — Ambulatory Visit: Payer: Medicare HMO | Admitting: Anesthesiology

## 2022-02-06 ENCOUNTER — Encounter: Payer: Self-pay | Admitting: Internal Medicine

## 2022-02-06 ENCOUNTER — Encounter
Admission: RE | Disposition: A | Discharge status: Home / Self Care | Payer: Self-pay | Source: Home / Self Care | Attending: Internal Medicine

## 2022-02-06 DIAGNOSIS — Z1211 Encounter for screening for malignant neoplasm of colon: Secondary | ICD-10-CM | POA: Diagnosis not present

## 2022-02-06 DIAGNOSIS — D123 Benign neoplasm of transverse colon: Secondary | ICD-10-CM | POA: Diagnosis not present

## 2022-02-06 DIAGNOSIS — I10 Essential (primary) hypertension: Secondary | ICD-10-CM | POA: Diagnosis not present

## 2022-02-06 DIAGNOSIS — Z980 Intestinal bypass and anastomosis status: Secondary | ICD-10-CM | POA: Insufficient documentation

## 2022-02-06 DIAGNOSIS — K219 Gastro-esophageal reflux disease without esophagitis: Secondary | ICD-10-CM | POA: Insufficient documentation

## 2022-02-06 DIAGNOSIS — K640 First degree hemorrhoids: Secondary | ICD-10-CM | POA: Diagnosis not present

## 2022-02-06 HISTORY — PX: COLONOSCOPY: SHX5424

## 2022-02-06 SURGERY — COLONOSCOPY
Anesthesia: General

## 2022-02-06 MED ORDER — SODIUM CHLORIDE 0.9 % IV SOLN: INTRAVENOUS | Status: DC

## 2022-02-06 MED ORDER — LIDOCAINE HCL (CARDIAC) PF 100 MG/5ML IV SOSY
PREFILLED_SYRINGE | INTRAVENOUS | Status: DC | PRN
  Administered 2022-02-06: 20 mg via INTRAVENOUS

## 2022-02-06 MED ORDER — PROPOFOL 10 MG/ML IV BOLUS
INTRAVENOUS | Status: DC | PRN
  Administered 2022-02-06: 60 mg via INTRAVENOUS
  Administered 2022-02-06: 10 mg via INTRAVENOUS
  Administered 2022-02-06: 20 mg via INTRAVENOUS

## 2022-02-06 MED ORDER — PROPOFOL 500 MG/50ML IV EMUL
INTRAVENOUS | Status: DC | PRN
  Administered 2022-02-06: 100 ug/kg/min via INTRAVENOUS

## 2022-02-06 NOTE — Anesthesia Preprocedure Evaluation (Signed)
Anesthesia Evaluation  Patient identified by MRN, date of birth, ID band Patient awake    Reviewed: Allergy & Precautions, H&P , NPO status , Patient's Chart, lab work & pertinent test results, reviewed documented beta blocker date and time   Airway Mallampati: II   Neck ROM: full    Dental  (+) Poor Dentition   Pulmonary neg pulmonary ROS, former smoker   Pulmonary exam normal        Cardiovascular hypertension, On Medications negative cardio ROS  Rhythm:regular Rate:Normal     Neuro/Psych negative neurological ROS  negative psych ROS   GI/Hepatic Neg liver ROS,GERD  Medicated,,  Endo/Other  negative endocrine ROS    Renal/GU negative Renal ROS  negative genitourinary   Musculoskeletal   Abdominal   Peds  Hematology negative hematology ROS (+)   Anesthesia Other Findings Past Medical History: No date: BPH (benign prostatic hyperplasia) No date: GERD (gastroesophageal reflux disease) No date: Hypertension No date: Pre-diabetes Past Surgical History: 2007: COLON RESECTION 06/10/2016: COLONOSCOPY WITH PROPOFOL; N/A     Comment:  Procedure: COLONOSCOPY WITH PROPOFOL;  Surgeon:               Lollie Sails, MD;  Location: [facility] ENDOSCOPY;                Service: Endoscopy;  Laterality: N/A;   Reproductive/Obstetrics negative OB ROS                             Anesthesia Physical Anesthesia Plan  ASA: 2  Anesthesia Plan: General   Post-op Pain Management:    Induction:   PONV Risk Score and Plan:   Airway Management Planned:   Additional Equipment:   Intra-op Plan:   Post-operative Plan:   Informed Consent: I have reviewed the patients History and Physical, chart, labs and discussed the procedure including the risks, benefits and alternatives for the proposed anesthesia with the patient or authorized representative who has indicated his/her understanding and acceptance.      Dental Advisory Given  Plan Discussed with: CRNA  Anesthesia Plan Comments:        Anesthesia Quick Evaluation

## 2022-02-06 NOTE — Anesthesia Postprocedure Evaluation (Signed)
Anesthesia Post Note  Patient: Zachary Mathews  Procedure(s) Performed: COLONOSCOPY  Patient location during evaluation: PACU Anesthesia Type: General Level of consciousness: awake and alert Pain management: pain level controlled Vital Signs Assessment: post-procedure vital signs reviewed and stable Respiratory status: spontaneous breathing, nonlabored ventilation, respiratory function stable and patient connected to nasal cannula oxygen Cardiovascular status: blood pressure returned to baseline and stable Postop Assessment: no apparent nausea or vomiting Anesthetic complications: no   No notable events documented.   Last Vitals:  Vitals:   02/06/22 1232 02/06/22 1326  BP: (!) 170/100 109/89  Pulse: 81 78  Resp: 18 19  Temp: (!) 36.2 C (!) 36.4 C  SpO2: 99% 96%    Last Pain:  Vitals:   02/06/22 1326  TempSrc: Temporal  PainSc: Asleep                 Ilene Qua

## 2022-02-06 NOTE — H&P (Signed)
Outpatient short stay form Pre-procedure 02/06/2022 12:34 PM Zachary Mathews, M.D.  Primary Physician: Harrel Lemon, M.D.  Reason for visit:  Personal history of colon polyps  History of present illness:  Personal history of colon polyps-due for routine 5-year colonoscopy. Had large polyp surgically resected 2001. He denies any lower GI alarm symptoms. He denies any prior issues with sedation.     Current Facility-Administered Medications:    0.9 %  sodium chloride infusion, , Intravenous, Continuous, Kearia Yin, Benay Pike, MD  Medications Prior to Admission  Medication Sig Dispense Refill Last Dose   Blood Glucose Monitoring Suppl (CONTOUR NEXT MONITOR) w/Device KIT 1 each by XX route as directed      dutasteride (AVODART) 0.5 MG capsule Take 0.5 mg by mouth daily.      Glucos-Chond-Hyal Ac-Ca Fructo (MOVE FREE JOINT HEALTH ADVANCE PO) Take by mouth.      glucosamine-chondroitin 500-400 MG tablet Take 2 tablets by mouth daily.      hydrochlorothiazide (HYDRODIURIL) 25 MG tablet Take 25 mg by mouth daily.      lisinopril (PRINIVIL,ZESTRIL) 40 MG tablet Take 40 mg by mouth daily.      magnesium chloride (SLOW-MAG) 64 MG TBEC SR tablet Take 2 tablets by mouth daily.      Magnesium Chloride 64 MG TBEC Take by mouth.      meloxicam (MOBIC) 15 MG tablet Take 1 tablet (15 mg total) by mouth daily as needed. 14 tablet 0    metoprolol tartrate (LOPRESSOR) 100 MG tablet Take 1 tablet (100 mg total) by mouth once for 1 dose. Please take one time dose '100mg'$  metoprolol tartrate 2 hr prior to cardiac CT for HR control IF HR >55bpm. 1 tablet 0    omeprazole (PRILOSEC) 40 MG capsule Take 40 mg by mouth daily.      potassium chloride SA (K-DUR,KLOR-CON) 20 MEQ tablet Take 20 mEq by mouth 2 (two) times daily.      sildenafil (VIAGRA) 100 MG tablet Take 1 tab 1 hour prior to intercourse 6 tablet 0    tamsulosin (FLOMAX) 0.4 MG CAPS capsule Take 0.4 mg by mouth daily before breakfast.        No Known  Allergies   Past Medical History:  Diagnosis Date   BPH (benign prostatic hyperplasia)    GERD (gastroesophageal reflux disease)    Hypertension    Pre-diabetes     Review of systems:  Otherwise negative.    Physical Exam  Gen: Alert, oriented. Appears stated age.  HEENT: [place]/AT. PERRLA. Lungs: CTA, no wheezes. CV: RR nl S1, S2. Abd: soft, benign, no masses. BS+ Ext: No edema. Pulses 2+    Planned procedures: Proceed with colonoscopy. The patient understands the nature of the planned procedure, indications, risks, alternatives and potential complications including but not limited to bleeding, infection, perforation, damage to internal organs and possible oversedation/side effects from anesthesia. The patient agrees and gives consent to proceed.  Please refer to procedure notes for findings, recommendations and patient disposition/instructions.     Kersti Scavone K. Alice Mathews, M.D. Gastroenterology 02/06/2022  12:34 PM

## 2022-02-06 NOTE — Op Note (Signed)
[facility] Gastroenterology Patient Name: Zachary Mathews Procedure Date: 02/06/2022 1:07 PM MRN: [id number] Account #: [id number] Date of Birth: November 02, 1945 Admit Type: Outpatient Age: 77 Room: [facility] ENDO ROOM 2 Gender: Male Note Status: Finalized Instrument Name: Park [id number] [id number] Procedure:             Colonoscopy Indications:           High risk colon cancer surveillance: Personal history                         of colonic polyps Providers:             Benay Pike. Chisom Muntean MD, MD Medicines:             Propofol per Anesthesia Complications:         No immediate complications. Procedure:             Pre-Anesthesia Assessment:                        - The risks and benefits of the procedure and the                         sedation options and risks were discussed with the                         patient. All questions were answered and informed                         consent was obtained.                        - Patient identification and proposed procedure were                         verified prior to the procedure by the nurse. The                         procedure was verified in the procedure room.                        - ASA Grade Assessment: III - A patient with severe                         systemic disease.                        - After reviewing the risks and benefits, the patient                         was deemed in satisfactory condition to undergo the                         procedure.                        After obtaining informed consent, the colonoscope was                         passed under direct vision. Throughout the procedure,  the patient's blood pressure, pulse, and oxygen                         saturations were monitored continuously. The                         Colonoscope was introduced through the anus and                         advanced to the the ileocolonic anastomosis. The                          colonoscopy was performed without difficulty. The                         patient tolerated the procedure well. The quality of                         the bowel preparation was adequate. Ileocolonic                         anastomosis and neoterminal ileum were photographed. Findings:      The perianal and digital rectal examinations were normal. Pertinent       negatives include normal sphincter tone and no palpable rectal lesions.      Non-bleeding internal hemorrhoids were found during retroflexion. The       hemorrhoids were Grade I (internal hemorrhoids that do not prolapse).      There was evidence of a prior side-to-side ileo-colonic anastomosis in       the distal ascending colon. This was patent and was characterized by       healthy appearing mucosa. The anastomosis was traversed.      A moderate amount of stool was found in the descending colon and in the       transverse colon, making visualization difficult. Lavage of the area was       performed using a moderate amount of sterile water, resulting in       clearance with adequate visualization.      Two sessile polyps were found in the proximal transverse colon. The       polyps were 4 to 5 mm in size. These polyps were removed with a jumbo       cold forceps. Resection and retrieval were complete.      The exam was otherwise without abnormality. Impression:            - Non-bleeding internal hemorrhoids.                        - Patent side-to-side ileo-colonic anastomosis,                         characterized by healthy appearing mucosa.                        - Stool in the descending colon and in the transverse                         colon.                        -  Two 4 to 5 mm polyps in the proximal transverse                         colon, removed with a jumbo cold forceps. Resected and                         retrieved.                        - The examination was otherwise normal. Recommendation:        - Patient  has a contact number available for                         emergencies. The signs and symptoms of potential                         delayed complications were discussed with the patient.                         Return to normal activities tomorrow. Written                         discharge instructions were provided to the patient.                        - Resume previous diet.                        - Continue present medications.                        - Await pathology results.                        - If polyps are benign or adenomatous without                         dysplasia, I will advise NO further colonoscopy due to                         advanced age and/or severe comorbidity.                        - Return to GI office PRN.                        - The findings and recommendations were discussed with                         the patient. Procedure Code(s):     --- Professional ---                        [phone], Colonoscopy, flexible; with biopsy, single or                         multiple Diagnosis Code(s):     --- Professional ---                        D12.3, Benign neoplasm of transverse colon (hepatic  flexure or splenic flexure)                        K64.0, First degree hemorrhoids                        Z98.0, Intestinal bypass and anastomosis status                        Z86.010, Personal history of colonic polyps CPT copyright 2022 American Medical Association. All rights reserved. The codes documented in this report are preliminary and upon coder review may  be revised to meet current compliance requirements. Efrain Sella MD, MD 02/06/2022 1:29:50 PM This report has been signed electronically. Number of Addenda: 0 Note Initiated On: 02/06/2022 1:07 PM Total Procedure Duration: 0 hours 6 minutes 33 seconds  Estimated Blood Loss:  Estimated blood loss: none. Estimated blood loss: none.      [facility]

## 2022-02-06 NOTE — Interval H&P Note (Signed)
History and Physical Interval Note:  02/06/2022 12:36 PM  Zachary Mathews  has presented today for surgery, with the diagnosis of History of colon polyps (Z86.010).  The various methods of treatment have been discussed with the patient and family. After consideration of risks, benefits and other options for treatment, the patient has consented to  Procedure(s): COLONOSCOPY (N/A) as a surgical intervention.  The patient's history has been reviewed, patient examined, no change in status, stable for surgery.  I have reviewed the patient's chart and labs.  Questions were answered to the patient's satisfaction.     [place], [place]

## 2022-02-06 NOTE — Transfer of Care (Signed)
Immediate Anesthesia Transfer of Care Note  Patient: Zachary Mathews  Procedure(s) Performed: COLONOSCOPY  Patient Location: PACU  Anesthesia Type:General  Level of Consciousness: drowsy  Airway & Oxygen Therapy: Patient Spontanous Breathing  Post-op Assessment: Report given to RN and Post -op Vital signs reviewed and stable  Post vital signs: Reviewed and stable  Last Vitals:  Vitals Value Taken Time  BP 109/89 02/06/22 1326  Temp 36.4 C 02/06/22 1326  Pulse 76 02/06/22 1327  Resp 19 02/06/22 1327  SpO2 96 % 02/06/22 1327  Vitals shown include unvalidated device data.  Last Pain:  Vitals:   02/06/22 1326  TempSrc: Temporal  PainSc: Asleep         Complications: No notable events documented.

## 2022-02-07 ENCOUNTER — Encounter: Payer: Self-pay | Admitting: Internal Medicine

## 2022-02-07 LAB — SURGICAL PATHOLOGY

## 2023-09-12 ENCOUNTER — Ambulatory Visit: Admitting: Urology

## 2023-09-12 ENCOUNTER — Encounter: Payer: Self-pay | Admitting: Urology

## 2023-09-12 VITALS — BP 131/84 | HR 95 | Ht 68.0 in | Wt 217.4 lb

## 2023-09-12 DIAGNOSIS — N401 Enlarged prostate with lower urinary tract symptoms: Secondary | ICD-10-CM | POA: Diagnosis not present

## 2023-09-12 DIAGNOSIS — R972 Elevated prostate specific antigen [PSA]: Secondary | ICD-10-CM

## 2023-09-12 DIAGNOSIS — N5201 Erectile dysfunction due to arterial insufficiency: Secondary | ICD-10-CM

## 2023-09-12 LAB — URINALYSIS, COMPLETE
Bilirubin, UA: NEGATIVE
Glucose, UA: NEGATIVE
Ketones, UA: NEGATIVE
Nitrite, UA: NEGATIVE
Protein,UA: NEGATIVE
RBC, UA: NEGATIVE
Specific Gravity, UA: 1.02 (ref 1.005–1.030)
Urobilinogen, Ur: 0.2 mg/dL (ref 0.2–1.0)
pH, UA: 6 (ref 5.0–7.5)

## 2023-09-12 LAB — MICROSCOPIC EXAMINATION: Bacteria, UA: NONE SEEN

## 2023-09-12 NOTE — Progress Notes (Unsigned)
 09/12/2023 11:11 AM   Zachary Mathews 1945-07-25 [id number]  Referring provider: Rudolpho Zachary BIRCH, MD [address] [facility] [facility] [place],  [place] [address]  Chief Complaint  Patient presents with   Elevated PSA    HPI: Zachary Mathews is a 78 y.o. male referred for evaluation of an elevated PSA.  PSA levels: 7.16 on 08/15/2023, 5.27 on 07/26/2022, 4.31 on 01/22/2022 Baseline PSA level: Low 1 range 2017-2021 Lower urinary tract symptoms: Urinary frequency and weak urinary stream Family history prostate cancer first-degree relatives: Brother diagnosed age 69 Past urologic history: I had seen 2014-2019 for BPH and erectile dysfunction.  Had been on tamsulosin and dutasteride since 2011 however states he stopped the medication 2-3 years ago.  Has noted worsening symptoms since discontinuing  PSA trend    Prostate Specific Ag, Serum  Latest Ref Rng 0.0 - 4.0 ng/mL  01/03/2016 (uncorrected) 1.01  01/04/2020 (uncorrected) 1.10  01/04/2021 3.75  01/22/2022 4.31  07/26/2022 5.27  08/15/2023 7.16    PMH: Past Medical History:  Diagnosis Date   BPH (benign prostatic hyperplasia)    Elevated PSA    GERD (gastroesophageal reflux disease)    Hypertension    Pre-diabetes     Surgical History: Past Surgical History:  Procedure Laterality Date   COLON RESECTION  2007   COLON SURGERY     COLONOSCOPY N/A 02/06/2022   Procedure: COLONOSCOPY;  Surgeon: Toledo, Ladell POUR, MD;  Location: [facility] ENDOSCOPY;  Service: Gastroenterology;  Laterality: N/A;   COLONOSCOPY WITH PROPOFOL  N/A 06/10/2016   Procedure: COLONOSCOPY WITH PROPOFOL ;  Surgeon: Gaylyn Gladis PENNER, MD;  Location: [facility] ENDOSCOPY;  Service: Endoscopy;  Laterality: N/A;    Home Medications:  Allergies as of 09/12/2023   No Known Allergies      Medication List        Accurate as of September 12, 2023 11:59 PM. If you have any questions, ask your nurse or doctor.          STOP taking these medications    dutasteride 0.5  MG capsule Commonly known as: AVODART Stopped by: Glendia JAYSON Barba   Magnesium Chloride 64 MG Tbec Stopped by: Glendia JAYSON Barba   tamsulosin 0.4 MG Caps capsule Commonly known as: FLOMAX Stopped by: Glendia JAYSON Barba       TAKE these medications    clobetasol cream 0.05 % Commonly known as: TEMOVATE Apply 1 Application topically 2 (two) times daily.   Contour Next Monitor w/Device Kit 1 each by XX route as directed   glucosamine-chondroitin 500-400 MG tablet Take 2 tablets by mouth daily.   hydrochlorothiazide 12.5 MG tablet Commonly known as: HYDRODIURIL Take 12.5 mg by mouth daily. What changed: Another medication with the same name was removed. Continue taking this medication, and follow the directions you see here. Changed by: Glendia JAYSON Barba   Klor-Con M10 10 MEQ tablet Generic drug: potassium chloride Take 10 mEq by mouth 2 (two) times daily. What changed: Another medication with the same name was removed. Continue taking this medication, and follow the directions you see here. Changed by: Glendia JAYSON Barba   lisinopril 20 MG tablet Commonly known as: ZESTRIL Take 20 mg by mouth daily. What changed: Another medication with the same name was removed. Continue taking this medication, and follow the directions you see here. Changed by: Glendia JAYSON Barba   magnesium chloride 64 MG Tbec SR tablet Commonly known as: SLOW-MAG Take 1 tablet by mouth 2 (two) times daily. What changed:  Another medication with the same name was removed. Continue taking this medication, and follow the directions you see here. Changed by: Glendia JAYSON Barba   meloxicam  15 MG tablet Commonly known as: MOBIC  Take 1 tablet (15 mg total) by mouth daily as needed.   metFORMIN 500 MG 24 hr tablet Commonly known as: GLUCOPHAGE-XR Take 1,000 mg by mouth 2 (two) times daily.   metoprolol  tartrate 100 MG tablet Commonly known as: LOPRESSOR  Take 1 tablet (100 mg total) by mouth once for 1 dose. Please  take one time dose 100mg  metoprolol  tartrate 2 hr prior to cardiac CT for HR control IF HR >55bpm.   MOVE FREE JOINT HEALTH ADVANCE PO Take by mouth.   omeprazole 20 MG capsule Commonly known as: PRILOSEC Take 20 mg by mouth daily. What changed: Another medication with the same name was removed. Continue taking this medication, and follow the directions you see here. Changed by: Glendia JAYSON Barba   OneTouch Ultra Test test strip Generic drug: glucose blood once daily Use as instructed.   sildenafil  100 MG tablet Commonly known as: VIAGRA  Take 1 tab 1 hour prior to intercourse   triamcinolone  cream 0.5 % Commonly known as: KENALOG  1 Application 2 (two) times daily.        Allergies: No Known Allergies  Family History: No family history on file.  Social History:  reports that he has quit smoking. He has never used smokeless tobacco. He reports that he does not drink alcohol and does not use drugs.   Physical Exam: BP 131/84 (BP Location: Left Arm, Patient Position: Sitting, Cuff Size: Large)   Pulse 95   Ht 5' 8 (1.727 m)   Wt 217 lb 6.4 oz (98.6 kg)   SpO2 96%   BMI 33.06 kg/m   Constitutional:  Alert and oriented, No acute distress. HEENT: [place] AT Respiratory: Normal respiratory effort, no increased work of breathing. GU: Prostate Psychiatric: Normal mood and affect.  Laboratory Data: No results found for: WBC, HGB, HCT, MCV, PLT  Lab Results  Component Value Date   CREATININE 1.10 05/03/2021    No results found for: PSA  No results found for: TESTOSTERONE  No results found for: HGBA1C  Urinalysis    Component Value Date/Time   APPEARANCEUR Hazy (A) 09/12/2023 0917   GLUCOSEU Negative 09/12/2023 0917   BILIRUBINUR Negative 09/12/2023 0917   PROTEINUR Negative 09/12/2023 0917   NITRITE Negative 09/12/2023 0917   LEUKOCYTESUR 1+ (A) 09/12/2023 0917    Lab Results  Component Value Date   LABMICR See below: 09/12/2023   WBCUA 6-10  (A) 09/12/2023   LABEPIT 0-10 09/12/2023   BACTERIA None seen 09/12/2023    Pertinent Imaging: *** No results found for this or any previous visit.  No results found for this or any previous visit.  No results found for this or any previous visit.  No results found for this or any previous visit.  No results found for this or any previous visit.  No results found for this or any previous visit.  No results found for this or any previous visit.  No results found for this or any previous visit.   Assessment & Plan:    1.  Elevated PSA Although PSA is a prostate cancer screening test he was informed that cancer is not the most common cause of an elevated PSA. Other potential causes including BPH and inflammation were discussed.  He was informed that the only way to adequately diagnose prostate cancer  would be transrectal ultrasound and biopsy of the prostate. The procedure was discussed including potential risks of bleeding and infection/sepsis. He was also informed that a negative biopsy does not conclusively rule out the possibility that prostate cancer may be present and that continued monitoring is required.  The use of newer adjunctive blood and urine tests to predict the probability of high-grade prostate cancer were discussed. The use of multiparametric prostate MRI to evaluate for lesions suspicious for high grade prostate cancer and aid in targeted bx was reviewed.  Continued periodic surveillance was also discussed. Urinalysis January 2025 showed significant pyuria.  UA repeated today and culture will be ordered if significant pyuria persists.  If no significant pyuria recommend prostate MRI to evaluate for lesions suspicious for high-grade prostate cancer  2.  BPH with LUTS May want to restart his prostate meds.  Recommend completing evaluation for his elevated PSA first   Glendia JAYSON Barba, MD  [facility] [address], [address] [place], [place] [address] [phone]

## 2023-09-14 ENCOUNTER — Telehealth: Payer: Self-pay | Admitting: Urology

## 2023-09-14 NOTE — Telephone Encounter (Signed)
 Please let patient know urinalysis showed no evidence of infection.  Recommend further evaluation with prostate MRI.  Order was placed.  Please send prep instructions

## 2023-09-15 NOTE — Telephone Encounter (Signed)
 Notified patient as instructed. Please contact Central Scheduling to set up your prostate MRI at [phone].  Prostate MRI Prep:  1- No ejaculation 48 hours prior to exam  2- No caffeine or carbonated beverages on day of the exam  3- Eat light diet evening prior and day of exam  4- Avoid eating 4 hours prior to exam  5- Fleets enema needs to be done 4 hours prior to exam -See below. Can be purchased at the drug store.

## 2023-09-16 ENCOUNTER — Encounter: Payer: Self-pay | Admitting: Urology

## 2023-09-18 ENCOUNTER — Ambulatory Visit
Admission: RE | Admit: 2023-09-18 | Discharge: 2023-09-18 | Disposition: A | Discharge status: Home / Self Care | Source: Ambulatory Visit | Attending: Urology | Admitting: Urology

## 2023-09-18 DIAGNOSIS — R972 Elevated prostate specific antigen [PSA]: Secondary | ICD-10-CM | POA: Diagnosis present

## 2023-09-18 MED ORDER — GADOBUTROL 1 MMOL/ML IV SOLN
9.0000 mL | Freq: Once | INTRAVENOUS | Status: AC | PRN
  Administered 2023-09-18: 9 mL via INTRAVENOUS

## 2023-09-26 ENCOUNTER — Ambulatory Visit: Payer: Self-pay | Admitting: Urology

## 2023-09-29 ENCOUNTER — Other Ambulatory Visit: Payer: Self-pay

## 2023-09-29 DIAGNOSIS — R972 Elevated prostate specific antigen [PSA]: Secondary | ICD-10-CM

## 2023-11-06 ENCOUNTER — Encounter: Payer: Self-pay | Admitting: Ophthalmology

## 2023-11-06 NOTE — Anesthesia Preprocedure Evaluation (Addendum)
 Anesthesia Evaluation  Patient identified by MRN, date of birth, ID band Patient awake    Reviewed: Allergy & Precautions, H&P , NPO status , Patient's Chart, lab work & pertinent test results  Airway Mallampati: III  TM Distance: >3 FB Neck ROM: Full   Comment: Tightly trimmed mustache and goatee Dental no notable dental hx. (+) Partial Upper, Partial Lower   Pulmonary shortness of breath, sleep apnea , former smoker   Pulmonary exam normal breath sounds clear to auscultation       Cardiovascular hypertension, + CAD and + DOE  Normal cardiovascular exam Rhythm:Regular Rate:Normal  03-11-23 echo  02=-DOPPLER ECHO and OTHER SPECIAL PROCEDURES                 Aortic: TRIVIAL AR                 No AS                         113.4 cm/sec peak vel      5.1 mmHg peak grad                         2.6 mmHg mean grad         4.0 cm^2 by DOPPLER                 Mitral: TRIVIAL MR                 No MS                         MV Inflow E Vel = 75.8 cm/sec      MV Annulus E'Vel = nm*                         E/E'Ratio = nm*              Tricuspid: MILD TR                    No TS                         249.8 cm/sec peak TR vel   28.0 mmHg peak RV pressure              Pulmonary: TRIVIAL PR                 No PS  _________________________________________________________________________________________  INTERPRETATION  NORMAL LEFT VENTRICULAR SYSTOLIC FUNCTION WITH AN ESTIMATED EF = >55 %  NORMAL RIGHT VENTRICULAR SYSTOLIC FUNCTION  MILD TRICUSPID VALVE INSUFFICIENCY  TRACE MITRAL AND AORTIC VALVE INSUFFICIENCY  NO VALVULAR STENOSIS  MILDLY DILATED AORTIC ROOT MEASURING UP TO 4.2 CM     Neuro/Psych  PSYCHIATRIC DISORDERS  Depression     Neuromuscular disease negative neurological ROS  negative psych ROS   GI/Hepatic negative GI ROS, Neg liver ROS,GERD  ,,  Endo/Other  diabetes    Renal/GU negative Renal ROS  negative  genitourinary   Musculoskeletal negative musculoskeletal ROS (+)    Abdominal   Peds negative pediatric ROS (+)  Hematology negative hematology ROS (+)   Anesthesia Other Findings GERD (gastroesophageal reflux disease) Hypertension BPH (benign prostatic hyperplasia) Pre-diabetes Elevated PSA  OSA (obstructive sleep apnea)--patient denies sleep apnea DOE (dyspnea on exertion)  Type 2 diabetes mellitus (HCC) CAD in native  artery  Severe obesity (BMI 35.0-39.9) with comorbidity (HCC) Edema of left lower extremity   Reports pruritic rash on back, top of head and groin. Plans to see dermatologist in 2 days.    Reproductive/Obstetrics negative OB ROS                              Anesthesia Physical Anesthesia Plan  ASA: 3  Anesthesia Plan: MAC   Post-op Pain Management:    Induction: Intravenous  PONV Risk Score and Plan:   Airway Management Planned: Natural Airway and Nasal Cannula  Additional Equipment:   Intra-op Plan:   Post-operative Plan:   Informed Consent: I have reviewed the patients History and Physical, chart, labs and discussed the procedure including the risks, benefits and alternatives for the proposed anesthesia with the patient or authorized representative who has indicated his/her understanding and acceptance.     Dental Advisory Given  Plan Discussed with: Anesthesiologist, CRNA and Surgeon  Anesthesia Plan Comments: (Patient consented for risks of anesthesia including but not limited to:  - adverse reactions to medications - damage to eyes, teeth, lips or other oral mucosa - nerve damage due to positioning  - sore throat or hoarseness - Damage to heart, brain, nerves, lungs, other parts of body or loss of life  Patient voiced understanding and assent.)         Anesthesia Quick Evaluation

## 2023-11-12 ENCOUNTER — Encounter: Payer: Self-pay | Admitting: Ophthalmology

## 2023-11-12 NOTE — Discharge Instructions (Signed)

## 2023-11-17 ENCOUNTER — Other Ambulatory Visit: Payer: Self-pay

## 2023-11-17 ENCOUNTER — Ambulatory Visit
Admission: RE | Admit: 2023-11-17 | Discharge: 2023-11-17 | Disposition: A | Discharge status: Home / Self Care | Attending: Ophthalmology | Admitting: Ophthalmology

## 2023-11-17 ENCOUNTER — Ambulatory Visit: Payer: Self-pay | Admitting: Anesthesiology

## 2023-11-17 ENCOUNTER — Encounter
Admission: RE | Disposition: A | Discharge status: Home / Self Care | Payer: Self-pay | Source: Home / Self Care | Attending: Ophthalmology

## 2023-11-17 ENCOUNTER — Encounter: Payer: Self-pay | Admitting: Ophthalmology

## 2023-11-17 DIAGNOSIS — I10 Essential (primary) hypertension: Secondary | ICD-10-CM | POA: Diagnosis not present

## 2023-11-17 DIAGNOSIS — Z87891 Personal history of nicotine dependence: Secondary | ICD-10-CM | POA: Diagnosis not present

## 2023-11-17 DIAGNOSIS — I2510 Atherosclerotic heart disease of native coronary artery without angina pectoris: Secondary | ICD-10-CM | POA: Insufficient documentation

## 2023-11-17 DIAGNOSIS — K219 Gastro-esophageal reflux disease without esophagitis: Secondary | ICD-10-CM | POA: Insufficient documentation

## 2023-11-17 DIAGNOSIS — Z7984 Long term (current) use of oral hypoglycemic drugs: Secondary | ICD-10-CM | POA: Insufficient documentation

## 2023-11-17 DIAGNOSIS — G4733 Obstructive sleep apnea (adult) (pediatric): Secondary | ICD-10-CM | POA: Diagnosis not present

## 2023-11-17 DIAGNOSIS — H2512 Age-related nuclear cataract, left eye: Secondary | ICD-10-CM | POA: Diagnosis present

## 2023-11-17 DIAGNOSIS — E1136 Type 2 diabetes mellitus with diabetic cataract: Secondary | ICD-10-CM | POA: Diagnosis not present

## 2023-11-17 HISTORY — DX: Dyspnea, unspecified: R06.00

## 2023-11-17 HISTORY — DX: Obstructive sleep apnea (adult) (pediatric): G47.33

## 2023-11-17 HISTORY — DX: Myoneural disorder, unspecified: G70.9

## 2023-11-17 HISTORY — DX: Other forms of dyspnea: R06.09

## 2023-11-17 HISTORY — DX: Localized edema: R60.0

## 2023-11-17 HISTORY — DX: Sleep apnea, unspecified: G47.30

## 2023-11-17 HISTORY — PX: CATARACT EXTRACTION W/PHACO: SHX586

## 2023-11-17 HISTORY — DX: Depression, unspecified: F32.A

## 2023-11-17 HISTORY — DX: Atherosclerotic heart disease of native coronary artery without angina pectoris: I25.10

## 2023-11-17 HISTORY — DX: Type 2 diabetes mellitus without complications: E11.9

## 2023-11-17 HISTORY — DX: Morbid (severe) obesity due to excess calories: E66.01

## 2023-11-17 LAB — GLUCOSE, CAPILLARY: Glucose-Capillary: 137 mg/dL — ABNORMAL HIGH (ref 70–99)

## 2023-11-17 SURGERY — PHACOEMULSIFICATION, CATARACT, WITH IOL INSERTION
Anesthesia: Monitor Anesthesia Care | Site: Eye | Laterality: Left

## 2023-11-17 MED ORDER — MIDAZOLAM HCL 2 MG/2ML IJ SOLN
INTRAMUSCULAR | Status: AC
  Filled 2023-11-17: qty 2

## 2023-11-17 MED ORDER — MIDAZOLAM HCL (PF) 2 MG/2ML IJ SOLN
INTRAMUSCULAR | Status: DC | PRN
  Administered 2023-11-17 (×2): 1 mg via INTRAVENOUS

## 2023-11-17 MED ORDER — TETRACAINE HCL 0.5 % OP SOLN
1.0000 [drp] | OPHTHALMIC | Status: DC | PRN
  Administered 2023-11-17 (×3): 1 [drp] via OPHTHALMIC

## 2023-11-17 MED ORDER — SIGHTPATH DOSE#1 NA HYALUR & NA CHOND-NA HYALUR IO KIT
PACK | INTRAOCULAR | Status: DC | PRN
Start: 2023-11-17 — End: 2023-11-17
  Administered 2023-11-17: 1 via OPHTHALMIC

## 2023-11-17 MED ORDER — FENTANYL CITRATE (PF) 100 MCG/2ML IJ SOLN: INTRAMUSCULAR | Status: DC | PRN

## 2023-11-17 MED ORDER — ARMC OPHTHALMIC DILATING DROPS
OPHTHALMIC | Status: AC
  Filled 2023-11-17: qty 0.5

## 2023-11-17 MED ORDER — FENTANYL CITRATE (PF) 100 MCG/2ML IJ SOLN
INTRAMUSCULAR | Status: DC | PRN
  Administered 2023-11-17: 50 ug via INTRAVENOUS

## 2023-11-17 MED ORDER — LACTATED RINGERS IV SOLN: INTRAVENOUS | Status: DC

## 2023-11-17 MED ORDER — LIDOCAINE HCL (PF) 2 % IJ SOLN
INTRAOCULAR | Status: DC | PRN
  Administered 2023-11-17: 4 mL via INTRAOCULAR

## 2023-11-17 MED ORDER — SIGHTPATH DOSE#1 BSS IO SOLN
INTRAOCULAR | Status: DC | PRN
  Administered 2023-11-17: 15 mL via INTRAOCULAR

## 2023-11-17 MED ORDER — TETRACAINE HCL 0.5 % OP SOLN
OPHTHALMIC | Status: AC
  Filled 2023-11-17: qty 4

## 2023-11-17 MED ORDER — SIGHTPATH DOSE#1 BSS IO SOLN
INTRAOCULAR | Status: DC | PRN
  Administered 2023-11-17: 105 mL via OPHTHALMIC

## 2023-11-17 MED ORDER — MOXIFLOXACIN HCL 0.5 % OP SOLN
OPHTHALMIC | Status: DC | PRN
Start: 2023-11-17 — End: 2023-11-17
  Administered 2023-11-17: .2 mL via OPHTHALMIC

## 2023-11-17 MED ORDER — ARMC OPHTHALMIC DILATING DROPS
1.0000 | OPHTHALMIC | Status: DC | PRN
  Administered 2023-11-17 (×3): 1 via OPHTHALMIC

## 2023-11-17 MED ORDER — FENTANYL CITRATE (PF) 100 MCG/2ML IJ SOLN
INTRAMUSCULAR | Status: AC
  Filled 2023-11-17: qty 2

## 2023-11-17 SURGICAL SUPPLY — 9 items
DISSECTOR HYDRO NUCLEUS 50X22 (MISCELLANEOUS) ×1 IMPLANT
FEE CATARACT SUITE SIGHTPATH (MISCELLANEOUS) ×1 IMPLANT
GLOVE PI ULTRA LF STRL 7.5 (GLOVE) ×1 IMPLANT
GLOVE SURG SYN 6.5 PF PI BL (GLOVE) ×1 IMPLANT
GLOVE SURG SYN 8.5 PF PI BL (GLOVE) ×1 IMPLANT
LENS IOL TECNIS EYHANCE 21.5 (Intraocular Lens) IMPLANT
NDL FILTER BLUNT 18X1 1/2 (NEEDLE) ×1 IMPLANT
NEEDLE FILTER BLUNT 18X1 1/2 (NEEDLE) ×1 IMPLANT
SYR 3ML LL SCALE MARK (SYRINGE) ×1 IMPLANT

## 2023-11-17 NOTE — Op Note (Signed)
 OPERATIVE NOTE  Zachary Mathews [id number] 11/17/2023   PREOPERATIVE DIAGNOSIS:  Nuclear sclerotic cataract left eye.  H25.12   POSTOPERATIVE DIAGNOSIS:    Nuclear sclerotic cataract left eye.     PROCEDURE:  Phacoemusification with posterior chamber intraocular lens placement of the left eye   LENS:   Implant Name Type Inv. Item Serial No. Manufacturer Lot No. LRB No. Used Action  LENS IOL TECNIS EYHANCE 21.5 - [id number] Intraocular Lens LENS IOL TECNIS EYHANCE 21.5 [id number] [employer]  Left 1 Implanted      Procedure(s): PHACOEMULSIFICATION, CATARACT, WITH IOL INSERTION 4.44 00:39.3 (Left)  SURGEON:  Adine Novak, MD, MPH   ANESTHESIA:  Topical with tetracaine drops augmented with 1% preservative-free intracameral lidocaine .  ESTIMATED BLOOD LOSS: <1 mL   COMPLICATIONS:  None.   DESCRIPTION OF PROCEDURE:  The patient was identified in the holding room and transported to the operating room and placed in the supine position under the operating microscope.  The left eye was identified as the operative eye and it was prepped and draped in the usual sterile ophthalmic fashion.   A 1.0 millimeter clear-corneal paracentesis was made at the 5:00 position. 0.5 ml of preservative-free 1% lidocaine  with epinephrine was injected into the anterior chamber.  The anterior chamber was filled with viscoelastic.  A 2.4 millimeter keratome was used to make a near-clear corneal incision at the 2:00 position.  A curvilinear capsulorrhexis was made with a cystotome and capsulorrhexis forceps.  Balanced salt solution was used to hydrodissect and hydrodelineate the nucleus.   Phacoemulsification was then used in stop and chop fashion to remove the lens nucleus and epinucleus.  The remaining cortex was then removed using the irrigation and aspiration handpiece. Viscoelastic was then placed into the capsular bag to distend it for lens placement.  A lens was then injected into the capsular bag.  The remaining  viscoelastic was aspirated.   Wounds were hydrated with balanced salt solution.  The anterior chamber was inflated to a physiologic pressure with balanced salt solution.  Intracameral vigamox 0.1 mL undiltued was injected into the eye and a drop placed onto the ocular surface.  No wound leaks were noted.  The patient was taken to the recovery room in stable condition without complications of anesthesia or surgery  Adine Novak 11/17/2023, 8:17 AM

## 2023-11-17 NOTE — Transfer of Care (Signed)
 Immediate Anesthesia Transfer of Care Note  Patient: Zachary Mathews  Procedure(s) Performed: PHACOEMULSIFICATION, CATARACT, WITH IOL INSERTION 4.44 00:39.3 (Left: Eye)  Patient Location: PACU  Anesthesia Type: MAC  Level of Consciousness: awake, alert  and patient cooperative  Airway and Oxygen Therapy: Patient Spontanous Breathing and Patient connected to supplemental oxygen  Post-op Assessment: Post-op Vital signs reviewed, Patient's Cardiovascular Status Stable, Respiratory Function Stable, Patent Airway and No signs of Nausea or vomiting  Post-op Vital Signs: Reviewed and stable  Complications: No notable events documented.

## 2023-11-17 NOTE — Anesthesia Postprocedure Evaluation (Signed)
 Anesthesia Post Note  Patient: Zachary Mathews  Procedure(s) Performed: PHACOEMULSIFICATION, CATARACT, WITH IOL INSERTION 4.44 00:39.3 (Left: Eye)  Patient location during evaluation: PACU Anesthesia Type: MAC Level of consciousness: awake and alert Pain management: pain level controlled Vital Signs Assessment: post-procedure vital signs reviewed and stable Respiratory status: spontaneous breathing, nonlabored ventilation, respiratory function stable and patient connected to nasal cannula oxygen Cardiovascular status: stable and blood pressure returned to baseline Postop Assessment: no apparent nausea or vomiting Anesthetic complications: no   No notable events documented.   Last Vitals:  Vitals:   11/17/23 0820 11/17/23 0825  BP: 119/85 115/83  Pulse: 72 73  Resp: 16 17  Temp:  (!) 36.2 C  SpO2: 98% 97%    Last Pain:  Vitals:   11/17/23 0825  TempSrc:   PainSc: 0-No pain                 Naileah Karg C Lyden Redner

## 2023-11-17 NOTE — H&P (Signed)
 [facility]   Primary Care Physician:  Rudolpho Norleen BIRCH, MD Ophthalmologist: Dr. Adine Novak  Pre-Procedure History & Physical: HPI:  Zachary Mathews is a 78 y.o. male here for cataract surgery.   Past Medical History:  Diagnosis Date   BPH (benign prostatic hyperplasia)    CAD in native artery    Depression    DOE (dyspnea on exertion)    Dyspnea    when walking in cold weather   Edema of left lower extremity    Elevated PSA    GERD (gastroesophageal reflux disease)    Hypertension    Neuromuscular disorder (HCC)    neuropathy in past when in [employer]   Severe obesity (BMI 35.0-39.9) with comorbidity (HCC)    Sleep apnea    Type 2 diabetes mellitus (HCC)     Past Surgical History:  Procedure Laterality Date   COLON RESECTION  2007   COLON SURGERY     COLONOSCOPY N/A 02/06/2022   Procedure: COLONOSCOPY;  Surgeon: Toledo, Ladell POUR, MD;  Location: [facility] ENDOSCOPY;  Service: Gastroenterology;  Laterality: N/A;   COLONOSCOPY WITH PROPOFOL  N/A 06/10/2016   Procedure: COLONOSCOPY WITH PROPOFOL ;  Surgeon: Gaylyn Gladis PENNER, MD;  Location: [facility] ENDOSCOPY;  Service: Endoscopy;  Laterality: N/A;    Prior to Admission medications   Medication Sig Start Date End Date Taking? Authorizing Provider  clobetasol cream (TEMOVATE) 0.05 % Apply 1 Application topically 2 (two) times daily. 06/20/23  Yes [provider]  Glucos-Chond-Hyal Ac-Ca Fructo (MOVE FREE JOINT HEALTH ADVANCE PO) Take by mouth.   Yes [provider]  lisinopril (ZESTRIL) 20 MG tablet Take 20 mg by mouth daily.   Yes [provider]  magnesium chloride (SLOW-MAG) 64 MG TBEC SR tablet Take 1 tablet by mouth 2 (two) times daily.   Yes [provider]  metFORMIN (GLUCOPHAGE-XR) 500 MG 24 hr tablet Take 1,000 mg by mouth 2 (two) times daily. 07/07/23  Yes [provider]  omeprazole (PRILOSEC) 20 MG capsule Take 20 mg by mouth daily. 11/18/11 04/01/24 Yes [provider]   spironolactone (ALDACTONE) 25 MG tablet Take 25 mg by mouth daily.   Yes [provider]  triamcinolone  cream (KENALOG ) 0.5 % 1 Application 2 (two) times daily. 06/19/23  Yes [provider]  Blood Glucose Monitoring Suppl (CONTOUR NEXT MONITOR) w/Device KIT 1 each by XX route as directed 04/24/17   [provider]  [facility] ULTRA TEST test strip once daily Use as instructed. 03/11/23   [provider]    Allergies as of 10/24/2023   (No Known Allergies)    History reviewed. No pertinent family history.  Social History   Socioeconomic History   Marital status: Widowed    Spouse name: Not on file   Number of children: Not on file   Years of education: Not on file   Highest education level: Not on file  Occupational History   Not on file  Tobacco Use   Smoking status: Former    Current packs/day: 0.00    Types: Cigarettes    Quit date: 01/21/1993    Years since quitting: 30.8   Smokeless tobacco: Never  Vaping Use   Vaping status: Never Used  Substance and Sexual Activity   Alcohol use: Yes    Comment: drinks 2-3 beer or liquor   Drug use: Never   Sexual activity: Yes  Other Topics Concern   Not on file  Social History Narrative   Not on file  Social Drivers of Corporate Investment Banker Strain: Not on file  Food Insecurity: Not on file  Transportation Needs: Not on file  Physical Activity: Not on file  Stress: Not on file  Social Connections: Not on file  Intimate Partner Violence: Not on file    Review of Systems: See HPI, otherwise negative ROS  Physical Exam: BP (!) 152/97   Pulse 81   Temp 97.7 F (36.5 C) (Temporal)   Resp 19   Ht 5' 7 (1.702 m)   Wt 99.8 kg   SpO2 99%   BMI 34.46 kg/m  General:   Alert, cooperative. Head:  Normocephalic and atraumatic. Respiratory:  Normal work of breathing. Cardiovascular:  NAD  Impression/Plan: Zachary Mathews is here for cataract surgery.  Risks, benefits, limitations,  and alternatives regarding cataract surgery have been reviewed with the patient.  Questions have been answered.  All parties agreeable.   Adine Novak, MD  11/17/2023, 7:51 AM

## 2023-11-24 ENCOUNTER — Encounter: Payer: Self-pay | Admitting: Ophthalmology

## 2023-11-28 NOTE — Discharge Instructions (Signed)

## 2023-12-01 ENCOUNTER — Encounter
Admission: RE | Disposition: A | Discharge status: Home / Self Care | Payer: Self-pay | Source: Home / Self Care | Attending: Ophthalmology

## 2023-12-01 ENCOUNTER — Other Ambulatory Visit: Payer: Self-pay

## 2023-12-01 ENCOUNTER — Ambulatory Visit: Payer: Self-pay | Admitting: Anesthesiology

## 2023-12-01 ENCOUNTER — Ambulatory Visit
Admission: RE | Admit: 2023-12-01 | Discharge: 2023-12-01 | Disposition: A | Discharge status: Home / Self Care | Attending: Ophthalmology | Admitting: Ophthalmology

## 2023-12-01 ENCOUNTER — Encounter: Payer: Self-pay | Admitting: Ophthalmology

## 2023-12-01 DIAGNOSIS — G4730 Sleep apnea, unspecified: Secondary | ICD-10-CM | POA: Insufficient documentation

## 2023-12-01 DIAGNOSIS — H2511 Age-related nuclear cataract, right eye: Secondary | ICD-10-CM | POA: Insufficient documentation

## 2023-12-01 DIAGNOSIS — E1136 Type 2 diabetes mellitus with diabetic cataract: Secondary | ICD-10-CM | POA: Diagnosis not present

## 2023-12-01 DIAGNOSIS — I10 Essential (primary) hypertension: Secondary | ICD-10-CM | POA: Diagnosis not present

## 2023-12-01 DIAGNOSIS — K219 Gastro-esophageal reflux disease without esophagitis: Secondary | ICD-10-CM | POA: Insufficient documentation

## 2023-12-01 DIAGNOSIS — Z87891 Personal history of nicotine dependence: Secondary | ICD-10-CM | POA: Insufficient documentation

## 2023-12-01 DIAGNOSIS — I2510 Atherosclerotic heart disease of native coronary artery without angina pectoris: Secondary | ICD-10-CM | POA: Diagnosis not present

## 2023-12-01 DIAGNOSIS — H25011 Cortical age-related cataract, right eye: Secondary | ICD-10-CM | POA: Diagnosis present

## 2023-12-01 HISTORY — PX: CATARACT EXTRACTION W/PHACO: SHX586

## 2023-12-01 LAB — GLUCOSE, CAPILLARY: Glucose-Capillary: 123 mg/dL — ABNORMAL HIGH (ref 70–99)

## 2023-12-01 SURGERY — PHACOEMULSIFICATION, CATARACT, WITH IOL INSERTION
Anesthesia: Monitor Anesthesia Care | Site: Eye | Laterality: Right

## 2023-12-01 MED ORDER — MOXIFLOXACIN HCL 0.5 % OP SOLN
OPHTHALMIC | Status: DC | PRN
  Administered 2023-12-01: .2 mL via OPHTHALMIC

## 2023-12-01 MED ORDER — CYCLOPENTOLATE HCL 2 % OP SOLN
OPHTHALMIC | Status: AC
  Filled 2023-12-01: qty 2

## 2023-12-01 MED ORDER — MIDAZOLAM HCL (PF) 2 MG/2ML IJ SOLN
INTRAMUSCULAR | Status: DC | PRN
  Administered 2023-12-01 (×2): 1 mg via INTRAVENOUS

## 2023-12-01 MED ORDER — LIDOCAINE HCL (PF) 2 % IJ SOLN
INTRAOCULAR | Status: DC | PRN
  Administered 2023-12-01: 4 mL via INTRAOCULAR

## 2023-12-01 MED ORDER — FENTANYL CITRATE (PF) 100 MCG/2ML IJ SOLN
INTRAMUSCULAR | Status: AC
  Filled 2023-12-01: qty 2

## 2023-12-01 MED ORDER — EPINEPHRINE PF 1 MG/ML IJ SOLN
INTRAMUSCULAR | Status: DC | PRN
  Administered 2023-12-01: 89 mL via OPHTHALMIC

## 2023-12-01 MED ORDER — SIGHTPATH DOSE#1 BSS IO SOLN
INTRAOCULAR | Status: DC | PRN
Start: 2023-12-01 — End: 2023-12-01
  Administered 2023-12-01: 15 mL via INTRAOCULAR

## 2023-12-01 MED ORDER — TETRACAINE HCL 0.5 % OP SOLN
1.0000 [drp] | OPHTHALMIC | Status: DC | PRN
  Administered 2023-12-01 (×3): 1 [drp] via OPHTHALMIC

## 2023-12-01 MED ORDER — ARMC OPHTHALMIC DILATING DROPS
OPHTHALMIC | Status: AC
  Filled 2023-12-01: qty 0.5

## 2023-12-01 MED ORDER — SIGHTPATH DOSE#1 NA HYALUR & NA CHOND-NA HYALUR IO KIT
PACK | INTRAOCULAR | Status: DC | PRN
  Administered 2023-12-01: 1 via OPHTHALMIC

## 2023-12-01 MED ORDER — MIDAZOLAM HCL 2 MG/2ML IJ SOLN
INTRAMUSCULAR | Status: AC
  Filled 2023-12-01: qty 2

## 2023-12-01 MED ORDER — LACTATED RINGERS IV SOLN: INTRAVENOUS | Status: DC

## 2023-12-01 MED ORDER — TETRACAINE HCL 0.5 % OP SOLN
OPHTHALMIC | Status: AC
  Filled 2023-12-01: qty 4

## 2023-12-01 MED ORDER — CYCLOPENTOLATE HCL 2 % OP SOLN
1.0000 [drp] | OPHTHALMIC | Status: DC | PRN
  Administered 2023-12-01 (×3): 1 [drp] via OPHTHALMIC

## 2023-12-01 MED ORDER — PHENYLEPHRINE HCL 10 % OP SOLN
1.0000 [drp] | OPHTHALMIC | Status: DC | PRN
  Administered 2023-12-01 (×3): 1 [drp] via OPHTHALMIC

## 2023-12-01 MED ORDER — PHENYLEPHRINE HCL 10 % OP SOLN
OPHTHALMIC | Status: AC
  Filled 2023-12-01: qty 5

## 2023-12-01 MED ORDER — FENTANYL CITRATE (PF) 100 MCG/2ML IJ SOLN
INTRAMUSCULAR | Status: DC | PRN
  Administered 2023-12-01: 50 ug via INTRAVENOUS

## 2023-12-01 SURGICAL SUPPLY — 9 items
DISSECTOR HYDRO NUCLEUS 50X22 (MISCELLANEOUS) ×1 IMPLANT
FEE CATARACT SUITE SIGHTPATH (MISCELLANEOUS) ×1 IMPLANT
GLOVE PI ULTRA LF STRL 7.5 (GLOVE) ×1 IMPLANT
GLOVE SURG SYN 6.5 PF PI BL (GLOVE) ×1 IMPLANT
GLOVE SURG SYN 8.5 PF PI BL (GLOVE) ×1 IMPLANT
LENS IOL TECNIS EYHANCE 22.0 (Intraocular Lens) IMPLANT
NDL FILTER BLUNT 18X1 1/2 (NEEDLE) ×1 IMPLANT
NEEDLE FILTER BLUNT 18X1 1/2 (NEEDLE) ×1 IMPLANT
SYR 3ML LL SCALE MARK (SYRINGE) ×1 IMPLANT

## 2023-12-01 NOTE — Transfer of Care (Signed)
 Immediate Anesthesia Transfer of Care Note  Patient: Zachary Mathews  Procedure(s) Performed: PHACOEMULSIFICATION, CATARACT, WITH IOL  6.12 00:46.7 (Right: Eye)  Patient Location: PACU  Anesthesia Type: MAC  Level of Consciousness: awake, alert  and patient cooperative  Airway and Oxygen Therapy: Patient Spontanous Breathing and Patient connected to supplemental oxygen  Post-op Assessment: Post-op Vital signs reviewed, Patient's Cardiovascular Status Stable, Respiratory Function Stable, Patent Airway and No signs of Nausea or vomiting  Post-op Vital Signs: Reviewed and stable  Complications: No notable events documented.

## 2023-12-01 NOTE — H&P (Signed)
 [facility]   Primary Care Physician:  Rudolpho Norleen BIRCH, MD Ophthalmologist: Dr. Adine Novak  Pre-Procedure History & Physical: HPI:  Zachary Mathews is a 78 y.o. male here for cataract surgery.   Past Medical History:  Diagnosis Date   BPH (benign prostatic hyperplasia)    CAD in native artery    Depression    DOE (dyspnea on exertion)    Dyspnea    when walking in cold weather   Edema of left lower extremity    Elevated PSA    GERD (gastroesophageal reflux disease)    Hypertension    Neuromuscular disorder (HCC)    neuropathy in past when in [employer]   Severe obesity (BMI 35.0-39.9) with comorbidity (HCC)    Sleep apnea    Type 2 diabetes mellitus [facility])     Past Surgical History:  Procedure Laterality Date   CATARACT EXTRACTION W/PHACO Left 11/17/2023   Procedure: PHACOEMULSIFICATION, CATARACT, WITH IOL INSERTION 4.44 00:39.3;  Surgeon: Novak Adine Anes, MD;  Location: [facility];  Service: Ophthalmology;  Laterality: Left;   COLON RESECTION  2007   COLON SURGERY     COLONOSCOPY N/A 02/06/2022   Procedure: COLONOSCOPY;  Surgeon: Toledo, Ladell POUR, MD;  Location: [facility] END[facility]COPY;  Service: Gastroenterology;  Laterality: N/A;   COLONOSCOPY WITH PROPOFOL  N/A 06/10/2016   Procedure: COLONOSCOPY WITH PROPOFOL ;  Surgeon: Gaylyn Gladis PENNER, MD;  Location: [facility];  Service: Endoscopy;  Laterality: N/A;    Prior to Admission medications   Medication Sig Start Date End Date Taking? Authorizing Provider  Blood Glucose Monitoring Suppl (CONTOUR NEXT MONITOR) w/Device KIT 1 each by XX route as directed 04/24/17  Yes [provider]  clobetasol cream (TEMOVATE) 0.05 % Apply 1 Application topically 2 (two) times daily. 06/20/23  Yes [provider]  Glucos-Chond-Hyal Ac-Ca Fructo (MOVE FREE JOINT HEALTH ADVANCE PO) Take by mouth.   Yes [provider]  lisinopril (ZESTRIL) 20 MG tablet Take 20 mg by mouth daily.   Yes [provider]   magnesium chloride (SLOW-MAG) 64 MG TBEC SR tablet Take 1 tablet by mouth 2 (two) times daily.   Yes [provider]  metFORMIN (GLUCOPHAGE-XR) 500 MG 24 hr tablet Take 1,000 mg by mouth 2 (two) times daily. 07/07/23  Yes [provider]  omeprazole (PRILOSEC) 20 MG capsule Take 20 mg by mouth daily. 11/18/11 04/01/24 Yes [provider]  ONETOUCH ULTRA TEST test strip once daily Use as instructed. 03/11/23  Yes [provider]  spironolactone (ALDACTONE) 25 MG tablet Take 25 mg by mouth daily.   Yes [provider]  triamcinolone  cream (KENALOG ) 0.5 % 1 Application 2 (two) times daily. 06/19/23  Yes [provider]    Allergies as of 10/24/2023   (No Known Allergies)    History reviewed. No pertinent family history.  Social History   Socioeconomic History   Marital status: Widowed    Spouse name: Not on file   Number of children: Not on file   Years of education: Not on file   Highest education level: Not on file  Occupational History   Not on file  Tobacco Use   Smoking status: Former    Current packs/day: 0.00    Types: Cigarettes    Quit date: 01/21/1993    Years since quitting: 30.8   Smokeless tobacco: Never  Vaping Use   Vaping status: Never Used  Substance and Sexual Activity   Alcohol use: Yes  Comment: drinks 2-3 beer or liquor   Drug use: Never   Sexual activity: Yes  Other Topics Concern   Not on file  Social History Narrative   Not on file   Social Drivers of Health   Financial Resource Strain: Not on file  Food Insecurity: Not on file  Transportation Needs: Not on file  Physical Activity: Not on file  Stress: Not on file  Social Connections: Not on file  Intimate Partner Violence: Not on file    Review of Systems: See HPI, otherwise negative ROS  Physical Exam: BP (!) 161/87   Pulse 79   Temp 98.7 F (37.1 C) (Temporal)   Resp 16   Ht 5' 7 (1.702 m)   Wt 103.9 kg   SpO2 97%   BMI 35.87  kg/m  General:   Alert, cooperative. Head:  Normocephalic and atraumatic. Respiratory:  Normal work of breathing. Cardiovascular:  NAD  Impression/Plan: Zachary Mathews is here for cataract surgery.  Risks, benefits, limitations, and alternatives regarding cataract surgery have been reviewed with the patient.  Questions have been answered.  All parties agreeable.   Adine Novak, MD  12/01/2023, 7:55 AM

## 2023-12-01 NOTE — Anesthesia Postprocedure Evaluation (Signed)
 Anesthesia Post Note  Patient: Zachary Mathews  Procedure(s) Performed: PHACOEMULSIFICATION, CATARACT, WITH IOL  6.12 00:46.7 (Right: Eye)  Patient location during evaluation: PACU Anesthesia Type: MAC Level of consciousness: awake and alert Pain management: pain level controlled Vital Signs Assessment: post-procedure vital signs reviewed and stable Respiratory status: spontaneous breathing, nonlabored ventilation, respiratory function stable and patient connected to nasal cannula oxygen Cardiovascular status: stable and blood pressure returned to baseline Postop Assessment: no apparent nausea or vomiting Anesthetic complications: no   No notable events documented.   Last Vitals:  Vitals:   12/01/23 0827 12/01/23 0832  BP: 137/87 (!) 148/91  Pulse: 75 80  Resp: (!) 21 (!) 25  Temp: (!) 36.4 C (!) 36.4 C  SpO2: 95% 98%    Last Pain:  Vitals:   12/01/23 0832  TempSrc:   PainSc: 0-No pain                 Zachary Mathews

## 2023-12-01 NOTE — Op Note (Signed)
 OPERATIVE NOTE  Zachary Mathews [id number] 12/01/2023   PREOPERATIVE DIAGNOSIS:  Nuclear sclerotic cataract right eye.  H25.11   POSTOPERATIVE DIAGNOSIS:    Nuclear sclerotic cataract right eye.     PROCEDURE:  Phacoemusification with posterior chamber intraocular lens placement of the right eye   LENS:   Implant Name Type Inv. Item Serial No. Manufacturer Lot No. LRB No. Used Action  LENS IOL TECNIS EYHANCE 22.0 - [id number] Intraocular Lens LENS IOL TECNIS EYHANCE 22.0 [id number] [employer]  Right 1 Implanted       Procedure(s): PHACOEMULSIFICATION, CATARACT, WITH IOL  6.12 00:46.7 (Right)  SURGEON:  Adine Novak, MD, MPH  ANESTHESIOLOGIST: Anesthesiologist: Ola Donny BROCKS, MD CRNA: Niemzyk, Haiyun, CRNA   ANESTHESIA:  Topical with tetracaine drops augmented with 1% preservative-free intracameral lidocaine .  ESTIMATED BLOOD LOSS: less than 1 mL.   COMPLICATIONS:  None.   DESCRIPTION OF PROCEDURE:  The patient was identified in the holding room and transported to the operating room and placed in the supine position under the operating microscope.  The right eye was identified as the operative eye and it was prepped and draped in the usual sterile ophthalmic fashion.   A 1.0 millimeter clear-corneal paracentesis was made at the 10:30 position. 0.5 ml of preservative-free 1% lidocaine  with epinephrine was injected into the anterior chamber.  The anterior chamber was filled with viscoelastic.  A 2.4 millimeter keratome was used to make a near-clear corneal incision at the 8:00 position.  A curvilinear capsulorrhexis was made with a cystotome and capsulorrhexis forceps.  Balanced salt solution was used to hydrodissect and hydrodelineate the nucleus.   Phacoemulsification was then used in stop and chop fashion to remove the lens nucleus and epinucleus.  The remaining cortex was then removed using the irrigation and aspiration handpiece. Viscoelastic was then placed into the capsular bag  to distend it for lens placement.  A lens was then injected into the capsular bag.  The remaining viscoelastic was aspirated.   Wounds were hydrated with balanced salt solution.  The anterior chamber was inflated to a physiologic pressure with balanced salt solution.   Intracameral vigamox 0.1 mL undiluted was injected into the eye and a drop placed onto the ocular surface.  No wound leaks were noted.  The patient was taken to the recovery room in stable condition without complications of anesthesia or surgery  Adine Novak 12/01/2023, 8:25 AM

## 2023-12-01 NOTE — Anesthesia Preprocedure Evaluation (Signed)
 Anesthesia Evaluation  Patient identified by MRN, date of birth, ID band Patient awake    Reviewed: Allergy & Precautions, H&P , NPO status , Patient's Chart, lab work & pertinent test results  Airway Mallampati: III  TM Distance: >3 FB    Comment: : Tightly trimmed mustache and goatee Dental  (+) Partial Lower, Partial Upper   Pulmonary neg pulmonary ROS, shortness of breath, sleep apnea , former smoker          Cardiovascular hypertension, + CAD and + DOE  negative cardio ROS   03-11-23 echo   02=-DOPPLER ECHO and OTHER SPECIAL PROCEDURES                 Aortic: TRIVIAL AR                 No AS                         113.4 cm/sec peak vel      5.1 mmHg peak grad                         2.6 mmHg mean grad         4.0 cm^2 by DOPPLER                 Mitral: TRIVIAL MR                 No MS                         MV Inflow E Vel = 75.8 cm/sec      MV Annulus E'Vel = nm*                         E/E'Ratio = nm*              Tricuspid: MILD TR                    No TS                         249.8 cm/sec peak TR vel   28.0 mmHg peak RV pressure              Pulmonary: TRIVIAL PR                 No PS  _________________________________________________________________________________________  INTERPRETATION  NORMAL LEFT VENTRICULAR SYSTOLIC FUNCTION WITH AN ESTIMATED EF = >55 %  NORMAL RIGHT VENTRICULAR SYSTOLIC FUNCTION  MILD TRICUSPID VALVE INSUFFICIENCY  TRACE MITRAL AND AORTIC VALVE INSUFFICIENCY  NO VALVULAR STENOSIS  MILDLY DILATED AORTIC ROOT MEASURING UP TO 4.2 CM    Neuro/Psych  PSYCHIATRIC DISORDERS  Depression     Neuromuscular disease negative neurological ROS  negative psych ROS   GI/Hepatic negative GI ROS, Neg liver ROS,GERD  ,,  Endo/Other  negative endocrine ROSdiabetes    Renal/GU negative Renal ROS  negative genitourinary   Musculoskeletal negative musculoskeletal ROS (+)    Abdominal    Peds negative pediatric ROS (+)  Hematology negative hematology ROS (+)   Anesthesia Other Findings Previous cataract surgery 11-17-23 Dr. Ola  GERD (gastroesophageal reflux disease)Hypertension BPH (benign prostatic hyperplasia)Pre-diabetes Elevated PSA     OSA (obstructive sleep apnea)--patient denies sleep apnea DOE (dyspnea on exertion)  Type 2 diabetes mellitus (HCC) CAD in native artery            Severe obesity (BMI 35.0-39.9) with comorbidity (HCC) Edema of left lower extremity    Rash on head has greatly improved since last visit; saw physician and received Rx.  Has some rash on back, legs and groin, treating that as well.    Reproductive/Obstetrics negative OB ROS                              Anesthesia Physical Anesthesia Plan  ASA: 3  Anesthesia Plan: MAC   Post-op Pain Management:    Induction: Intravenous  PONV Risk Score and Plan:   Airway Management Planned: Natural Airway and Nasal Cannula  Additional Equipment:   Intra-op Plan:   Post-operative Plan:   Informed Consent: I have reviewed the patients History and Physical, chart, labs and discussed the procedure including the risks, benefits and alternatives for the proposed anesthesia with the patient or authorized representative who has indicated his/her understanding and acceptance.     Dental Advisory Given  Plan Discussed with: Anesthesiologist, CRNA and Surgeon  Anesthesia Plan Comments: (Patient consented for risks of anesthesia including but not limited to:  - adverse reactions to medications - damage to eyes, teeth, lips or other oral mucosa - nerve damage due to positioning  - sore throat or hoarseness - Damage to heart, brain, nerves, lungs, other parts of body or loss of life  Patient voiced understanding and assent.)         Anesthesia Quick Evaluation

## 2023-12-25 IMAGING — CT CT HEART MORP W/ CTA COR W/ SCORE W/ CA W/CM &/OR W/O CM
1 of 13 series · 4 of 20 positions shown, 5 images · non-contrast
Comparison: CT chest dated March 22, 2014.

Addendum:
CLINICAL DATA: Chest pain

EXAM:
Cardiac/Coronary  CTA
TECHNIQUE: The patient was scanned on a Siemens Somatom go.Top scanner.

[Series 4: multiphase % cta coronary 0.60 · axial · 0.38mm/px · z∈[-1107,-1037]mm · 4 of 3516 slices shown, 5 images]
[im 704/3516  vessel]
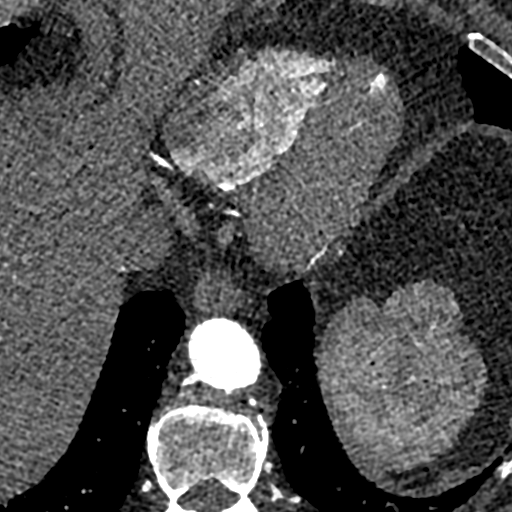
[im 704/3516  lung]
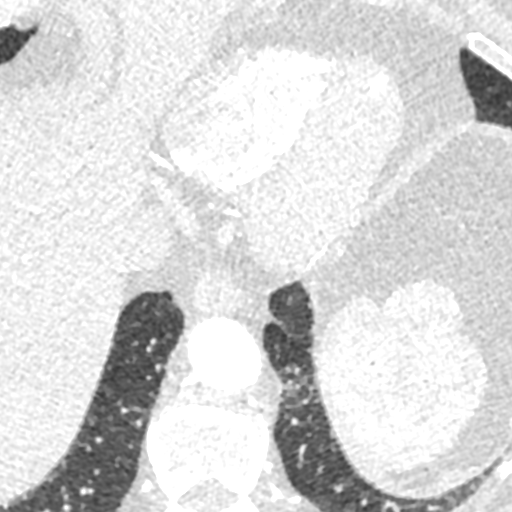
[im 1407/3516  vessel]
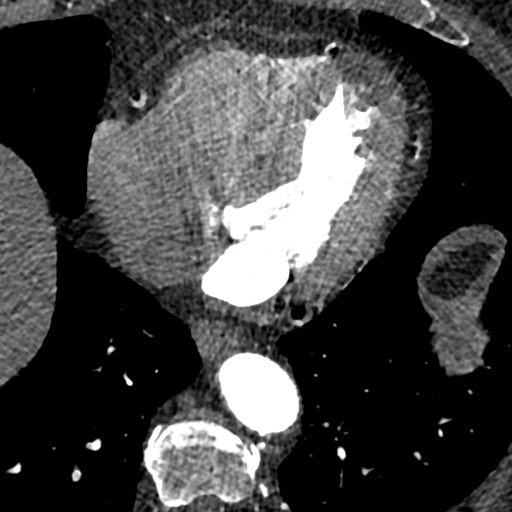
[im 2110/3516  vessel]
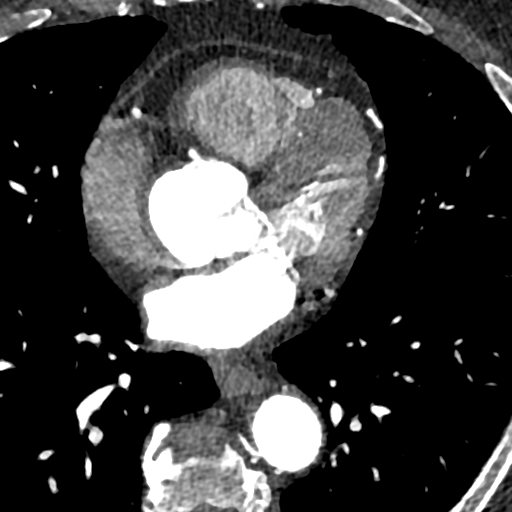
[im 2813/3516  vessel]
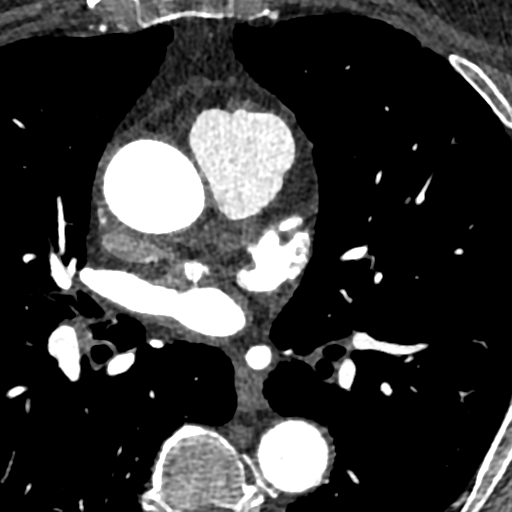

[4 of 20 positions shown; findings below may reference images not displayed]



Aortic Valve:  Trileaflet.  No calcifications.

Coronary Arteries:  Normal coronary origin.  Right dominance.

RCA is a dominant artery that gives rise to PDA and PLA. There is
calcified plaque in the mid RCA causing mild stenosis (25%).

Left main is a large artery that gives rise to LAD and LCX arteries.
There is no LM disease.

LAD has calcified plaque in the proximal and mid segments causing
moderate stenosis (50%).

LCX is a non-dominant artery that gives rise to three obtuse
marginal branches. There is calcified plaque in the proximal LCx
causing minimal stenosis (<25%).

Other findings:

Normal pulmonary vein drainage into the left atrium.

Normal left atrial appendage without a thrombus.

Normal size of the pulmonary artery.
IMPRESSION: 1. Coronary calcium score of 746. This was 87th percentile for age
and sex matched control.

2. Normal coronary origin with right dominance.

3. Moderate mid LAD stenosis (50%).

4. Mild mid RCA stenosis, minimal LCx disease.

5. CAD-RADS 3.  Moderate stenosis.

6. Additional analysis with CT FFR will be submitted and reported
separately.

EXAM:
OVER-READ INTERPRETATION  CT CHEST

The following report is an over-read performed by radiologist Dr.
over-read does not include interpretation of cardiac or coronary
anatomy or pathology. The coronary calcium score/coronary CTA
interpretation by the cardiologist is attached.
FINDINGS: Cardiovascular: There are no significant vascular findings. The
heart size is normal. There is no pericardial effusion.

Mediastinum/Nodes: There are no enlarged lymph nodes.The visualized
esophagus demonstrates no significant findings.

Lungs/Pleura: Clear lungs. No pneumothorax or pleural effusion.

Upper abdomen: No acute abnormality.

Musculoskeletal/Chest wall: No chest wall abnormality. No acute or
significant osseous findings.
IMPRESSION: 1. No significant extracardiac findings.



Aortic Valve:  Trileaflet.  No calcifications.

Coronary Arteries:  Normal coronary origin.  Right dominance.

RCA is a dominant artery that gives rise to PDA and PLA. There is
calcified plaque in the mid RCA causing mild stenosis (25%).

Left main is a large artery that gives rise to LAD and LCX arteries.
There is no LM disease.

LAD has calcified plaque in the proximal and mid segments causing
moderate stenosis (50%).

LCX is a non-dominant artery that gives rise to three obtuse
marginal branches. There is calcified plaque in the proximal LCx
causing minimal stenosis (<25%).

Other findings:

Normal pulmonary vein drainage into the left atrium.

Normal left atrial appendage without a thrombus.

Normal size of the pulmonary artery.
IMPRESSION: 1. Coronary calcium score of 746. This was 87th percentile for age
and sex matched control.

2. Normal coronary origin with right dominance.

3. Moderate mid LAD stenosis (50%).

4. Mild mid RCA stenosis, minimal LCx disease.

5. CAD-RADS 3.  Moderate stenosis.

6. Additional analysis with CT FFR will be submitted and reported
separately.

## 2024-03-24 ENCOUNTER — Other Ambulatory Visit

## 2024-03-30 ENCOUNTER — Ambulatory Visit: Admitting: Urology
